# Patient Record
Sex: Female | Born: 1955 | Race: White | Hispanic: No | Smoking: Former smoker
Health system: Southern US, Community
[De-identification: ages and names within clinical notes are randomized; demographics above are authoritative.]

## PROBLEM LIST (undated history)

## (undated) DIAGNOSIS — M1990 Unspecified osteoarthritis, unspecified site: Secondary | ICD-10-CM

## (undated) DIAGNOSIS — M3500 Sicca syndrome, unspecified: Secondary | ICD-10-CM

## (undated) DIAGNOSIS — M069 Rheumatoid arthritis, unspecified: Secondary | ICD-10-CM

## (undated) DIAGNOSIS — I10 Essential (primary) hypertension: Secondary | ICD-10-CM

## (undated) HISTORY — DX: Sjogren syndrome, unspecified: M35.00

## (undated) HISTORY — PX: FOOT SURGERY: SHX648

## (undated) HISTORY — DX: Rheumatoid arthritis, unspecified: M06.9

## (undated) HISTORY — PX: COLONOSCOPY: SHX174

---

## 2015-11-13 HISTORY — PX: COLONOSCOPY: SHX174

## 2017-03-06 ENCOUNTER — Other Ambulatory Visit: Payer: Self-pay | Admitting: Obstetrics and Gynecology

## 2017-03-06 DIAGNOSIS — Z1231 Encounter for screening mammogram for malignant neoplasm of breast: Secondary | ICD-10-CM

## 2017-03-20 ENCOUNTER — Encounter: Payer: Self-pay | Admitting: Radiology

## 2017-03-20 ENCOUNTER — Ambulatory Visit
Admission: RE | Admit: 2017-03-20 | Discharge: 2017-03-20 | Disposition: A | Discharge status: Home / Self Care | Payer: 59 | Source: Ambulatory Visit | Attending: Obstetrics and Gynecology | Admitting: Obstetrics and Gynecology

## 2017-03-20 DIAGNOSIS — Z1231 Encounter for screening mammogram for malignant neoplasm of breast: Secondary | ICD-10-CM | POA: Diagnosis not present

## 2017-03-25 ENCOUNTER — Other Ambulatory Visit: Payer: Self-pay | Admitting: *Deleted

## 2017-03-25 ENCOUNTER — Inpatient Hospital Stay
Admission: RE | Admit: 2017-03-25 | Discharge: 2017-03-25 | Disposition: A | Discharge status: Home / Self Care | Payer: Self-pay | Source: Ambulatory Visit | Attending: *Deleted | Admitting: *Deleted

## 2017-03-25 DIAGNOSIS — Z9289 Personal history of other medical treatment: Secondary | ICD-10-CM

## 2017-11-21 ENCOUNTER — Ambulatory Visit
Admission: EM | Admit: 2017-11-21 | Discharge: 2017-11-21 | Disposition: A | Discharge status: Home / Self Care | Payer: 59 | Attending: Family Medicine | Admitting: Family Medicine

## 2017-11-21 ENCOUNTER — Encounter: Payer: Self-pay | Admitting: *Deleted

## 2017-11-21 ENCOUNTER — Telehealth: Payer: Self-pay

## 2017-11-21 DIAGNOSIS — M722 Plantar fascial fibromatosis: Secondary | ICD-10-CM | POA: Diagnosis not present

## 2017-11-21 DIAGNOSIS — M79672 Pain in left foot: Secondary | ICD-10-CM

## 2017-11-21 HISTORY — DX: Essential (primary) hypertension: I10

## 2017-11-21 MED ORDER — MELOXICAM 15 MG PO TABS: 15.0000 mg | ORAL_TABLET | Freq: Every day | ORAL | 0 refills | Status: DC | PRN

## 2017-11-21 NOTE — ED Triage Notes (Signed)
Left heel and foot pain x1 month. Denies injury.

## 2017-11-21 NOTE — Discharge Instructions (Signed)
Heel cups.  Rest, ice.  Medication as prescribed.  Exercises: 10 times daily (holds are for 3-5 secs).  Take care  Dr. Adriana Simas

## 2017-11-21 NOTE — ED Provider Notes (Signed)
[facility]   CSN: [id number] Arrival date & time: 11/21/17  0910  History   Chief Complaint Chief Complaint  Patient presents with  . Foot Pain   HPI  62 year old female presents with heel pain.  Patient reports a one-month history of left heel pain.  No fall, trauma, injury.  She is on her feet often.  Has to wear steel toed shoes at work and is constantly on concrete.  She feels that she has plantar fasciitis.  She has been trying stretches with use of a roller without resolution.  She states that she has pain with walking.  Pain is currently 5/10 in severity.  Worse with activity/ambulation.  No relieving factors.  No other associated symptoms.  No other complaints at this time.  PMH: Anxiety, unspecified    Depression, unspecified    ADD (attention deficit disorder)    Arthritis    Hypertension    Hypercholesteremia     Surgical Hx: bunionectomy   joint taken out later  COLONOSCOPY 09/21/2016 N/A Procedure: COLONOSCOPY, FLEXIBLE; DIAGNOSTIC; Surgeon: Glynis Smiles, MD; Location: [facility] OR; Service: General Surgery; Laterality: N/A;   OB History    No data available     Home Medications    Prior to Admission medications   Medication Sig Start Date End Date Taking? Authorizing Provider  ALPRAZolam Prudy Feeler) 0.5 MG tablet Take 0.5 mg by mouth at bedtime as needed for anxiety.   Yes [provider]  DULoxetine (CYMBALTA) 60 MG capsule Take 60 mg by mouth daily.   Yes [provider]  lisinopril (PRINIVIL,ZESTRIL) 20 MG tablet Take 20 mg by mouth daily.   Yes [provider]  methylphenidate (CONCERTA) 54 MG PO CR tablet Take 54 mg by mouth every morning.   Yes [provider]  meloxicam (MOBIC) 15 MG tablet Take 1 tablet (15 mg total) by mouth daily as needed for pain. 11/21/17   Tommie Sams, DO   Family History Family History  Problem Relation Age of Onset  . Polymyositis Mother   . CAD Father   . Breast cancer  Neg Hx    Social History Social History   Tobacco Use  . Smoking status: Former Games developer  . Smokeless tobacco: Never Used  Substance Use Topics  . Alcohol use: Yes  . Drug use: No   Allergies   Patient has no known allergies.  Review of Systems Review of Systems  Constitutional: Negative.   Musculoskeletal: Positive for gait problem.       Heel pain.  All other systems reviewed and are negative.  Physical Exam Triage Vital Signs ED Triage Vitals  Enc Vitals Group     BP 11/21/17 0924 (!) 140/99     Pulse Rate 11/21/17 0924 85     Resp 11/21/17 0924 16     Temp 11/21/17 0924 98.5 F (36.9 C)     Temp Source 11/21/17 0924 Oral     SpO2 11/21/17 0924 98 %     Weight 11/21/17 0926 155 lb (70.3 kg)     Height 11/21/17 0926 5\' 3"  (1.6 m)     Head Circumference --      Peak Flow --      Pain Score 11/21/17 0926 5     Pain Loc --      Pain Edu? --      Excl. in GC? --    Updated Vital Signs BP (!) 140/99 (BP Location: Left Arm)   Pulse 85  Temp 98.5 F (36.9 C) (Oral)   Resp 16   Ht 5\' 3"  (1.6 m)   Wt 155 lb (70.3 kg)   SpO2 98%   BMI 27.46 kg/m    Physical Exam  Constitutional: She is oriented to person, place, and time. She appears well-developed. No distress.  Cardiovascular: Normal rate and regular rhythm.  No murmur heard. Pulmonary/Chest: Effort normal and breath sounds normal. She has no wheezes. She has no rales.  Musculoskeletal:  Left foot -patient with tenderness at the heel at the attachment site of the plantar fascia.  Swelling noted of the heel.  Normal range of motion of the ankle.  Ankle nontender.  Neurological: She is alert and oriented to person, place, and time.  Skin: Skin is warm. No rash noted.  Psychiatric: She has a normal mood and affect. Her behavior is normal.  Nursing note and vitals reviewed.  UC Treatments / Results  Labs (all labs ordered are listed, but only abnormal results are displayed) Labs Reviewed - No data to  display  EKG  EKG Interpretation None       Radiology No results found.  Procedures Procedures (including critical care time)  Medications Ordered in UC Medications - No data to display   Initial Impression / Assessment and Plan / UC Course  I have reviewed the triage vital signs and the nursing notes.  Pertinent labs & imaging results that were available during my care of the patient were reviewed by me and considered in my medical decision making (see chart for details).     62 year old female presents with plantar fasciitis.  Treating with rest, exercises, heel cups, meloxicam.  Final Clinical Impressions(s) / UC Diagnoses   Final diagnoses:  Plantar fasciitis of left foot    ED Discharge Orders        Ordered    meloxicam (MOBIC) 15 MG tablet  Daily PRN     11/21/17 0941     Controlled Substance Prescriptions [place] Controlled Substance Registry consulted? Not Applicable   01/19/18, DO 11/21/17 1000

## 2017-11-22 ENCOUNTER — Encounter: Payer: Self-pay | Admitting: Obstetrics and Gynecology

## 2017-11-22 ENCOUNTER — Ambulatory Visit (INDEPENDENT_AMBULATORY_CARE_PROVIDER_SITE_OTHER): Payer: 59 | Admitting: Obstetrics and Gynecology

## 2017-11-22 DIAGNOSIS — Z124 Encounter for screening for malignant neoplasm of cervix: Secondary | ICD-10-CM

## 2017-11-22 DIAGNOSIS — Z01419 Encounter for gynecological examination (general) (routine) without abnormal findings: Secondary | ICD-10-CM

## 2017-11-22 DIAGNOSIS — Z1231 Encounter for screening mammogram for malignant neoplasm of breast: Secondary | ICD-10-CM | POA: Diagnosis not present

## 2017-11-22 DIAGNOSIS — Z1239 Encounter for other screening for malignant neoplasm of breast: Secondary | ICD-10-CM

## 2017-11-22 NOTE — Patient Instructions (Signed)
Preventive Care 40-64 Years, Female Preventive care refers to lifestyle choices and visits with your health care provider that can promote health and wellness. What does preventive care include?  A yearly physical exam. This is also called an annual well check.  Dental exams once or twice a year.  Routine eye exams. Ask your health care provider how often you should have your eyes checked.  Personal lifestyle choices, including: ? Daily care of your teeth and gums. ? Regular physical activity. ? Eating a healthy diet. ? Avoiding tobacco and drug use. ? Limiting alcohol use. ? Practicing safe sex. ? Taking low-dose aspirin daily starting at age 71. ? Taking vitamin and mineral supplements as recommended by your health care provider. What happens during an annual well check? The services and screenings done by your health care provider during your annual well check will depend on your age, overall health, lifestyle risk factors, and family history of disease. Counseling Your health care provider may ask you questions about your:  Alcohol use.  Tobacco use.  Drug use.  Emotional well-being.  Home and relationship well-being.  Sexual activity.  Eating habits.  Work and work Statistician.  Method of birth control.  Menstrual cycle.  Pregnancy history.  Screening You may have the following tests or measurements:  Height, weight, and BMI.  Blood pressure.  Lipid and cholesterol levels. These may be checked every 5 years, or more frequently if you are over 8 years old.  Skin check.  Lung cancer screening. You may have this screening every year starting at age 72 if you have a 30-pack-year history of smoking and currently smoke or have quit within the past 15 years.  Fecal occult blood test (FOBT) of the stool. You may have this test every year starting at age 42.  Flexible sigmoidoscopy or colonoscopy. You may have a sigmoidoscopy every 5 years or a colonoscopy  every 10 years starting at age 41.  Hepatitis C blood test.  Hepatitis B blood test.  Sexually transmitted disease (STD) testing.  Diabetes screening. This is done by checking your blood sugar (glucose) after you have not eaten for a while (fasting). You may have this done every 1-3 years.  Mammogram. This may be done every 1-2 years. Talk to your health care provider about when you should start having regular mammograms. This may depend on whether you have a family history of breast cancer.  BRCA-related cancer screening. This may be done if you have a family history of breast, ovarian, tubal, or peritoneal cancers.  Pelvic exam and Pap test. This may be done every 3 years starting at age 40. Starting at age 45, this may be done every 5 years if you have a Pap test in combination with an HPV test.  Bone density scan. This is done to screen for osteoporosis. You may have this scan if you are at high risk for osteoporosis.  Discuss your test results, treatment options, and if necessary, the need for more tests with your health care provider. Vaccines Your health care provider may recommend certain vaccines, such as:  Influenza vaccine. This is recommended every year.  Tetanus, diphtheria, and acellular pertussis (Tdap, Td) vaccine. You may need a Td booster every 10 years.  Varicella vaccine. You may need this if you have not been vaccinated.  Zoster vaccine. You may need this after age 91.  Measles, mumps, and rubella (MMR) vaccine. You may need at least one dose of MMR if you were born in  1957 or later. You may also need a second dose.  Pneumococcal 13-valent conjugate (PCV13) vaccine. You may need this if you have certain conditions and were not previously vaccinated.  Pneumococcal polysaccharide (PPSV23) vaccine. You may need one or two doses if you smoke cigarettes or if you have certain conditions.  Meningococcal vaccine. You may need this if you have certain  conditions.  Hepatitis A vaccine. You may need this if you have certain conditions or if you travel or work in places where you may be exposed to hepatitis A.  Hepatitis B vaccine. You may need this if you have certain conditions or if you travel or work in places where you may be exposed to hepatitis B.  Haemophilus influenzae type b (Hib) vaccine. You may need this if you have certain conditions.  Talk to your health care provider about which screenings and vaccines you need and how often you need them. This information is not intended to replace advice given to you by your health care provider. Make sure you discuss any questions you have with your health care provider. Document Released: 11/25/2015 Document Revised: 07/18/2016 Document Reviewed: 08/30/2015 Elsevier Interactive Patient Education  2018 Elsevier Inc.  

## 2017-11-22 NOTE — Progress Notes (Signed)
Patient ID: Lauren Bush, female   DOB: Nov 29, 1955, 62 y.o.   MRN: [id number]     Gynecology Annual Exam  PCP: Garry Heater, MD  Chief Complaint:  Chief Complaint  Patient presents with  . Gynecologic Exam    History of Present Illness:Patient is a 62 y.o. [id number] presents for annual exam. The patient has no complaints today.   LMP: No LMP recorded. Patient is postmenopausal.  The patient is sexually active. She denies dyspareunia.  The patient does perform self breast exams.  There is no notable family history of breast or ovarian cancer in her family.  The patient wears seatbelts: yes.   The patient has regular exercise: not asked.    The patient denies current symptoms of depression.     Review of Systems: Review of Systems  Constitutional: Negative for chills and fever.  HENT: Negative for congestion.   Respiratory: Negative for cough and shortness of breath.   Cardiovascular: Negative for chest pain and palpitations.  Gastrointestinal: Negative for abdominal pain, constipation, diarrhea, heartburn, nausea and vomiting.  Genitourinary: Negative for dysuria, frequency and urgency.  Skin: Negative for itching and rash.  Neurological: Negative for dizziness and headaches.  Endo/Heme/Allergies: Negative for polydipsia.  Psychiatric/Behavioral: Negative for depression.    Past Medical History:  Past Medical History:  Diagnosis Date  . Hypertension     Past Surgical History:  History reviewed. No pertinent surgical history.  Gynecologic History:  No LMP recorded. Patient is postmenopausal. Last Pap: Results were: 10/17/2016 NIL and HR HPV negative  Last mammogram: 03/20/17 Results were: Elby Showers I Obstetric History: [id number]  Family History:  Family History  Problem Relation Age of Onset  . Polymyositis Mother   . CAD Father   . Breast cancer Neg Hx     Social History:  Social History   Socioeconomic History  . Marital status: Divorced    Spouse  name: Not on file  . Number of children: Not on file  . Years of education: Not on file  . Highest education level: Not on file  Social Needs  . Financial resource strain: Not on file  . Food insecurity - worry: Not on file  . Food insecurity - inability: Not on file  . Transportation needs - medical: Not on file  . Transportation needs - non-medical: Not on file  Occupational History  . Not on file  Tobacco Use  . Smoking status: Former Games developer  . Smokeless tobacco: Never Used  Substance and Sexual Activity  . Alcohol use: Yes  . Drug use: No  . Sexual activity: Yes    Birth control/protection: None  Other Topics Concern  . Not on file  Social History Narrative  . Not on file    Allergies:  No Known Allergies  Medications: Prior to Admission medications   Medication Sig Start Date End Date Taking? Authorizing Provider  ALPRAZolam Prudy Feeler) 0.5 MG tablet Take 0.5 mg by mouth at bedtime as needed for anxiety.   Yes [provider]  DULoxetine (CYMBALTA) 60 MG capsule Take 60 mg by mouth daily.   Yes [provider]  lisinopril (PRINIVIL,ZESTRIL) 20 MG tablet Take 20 mg by mouth daily.   Yes [provider]  meloxicam (MOBIC) 15 MG tablet Take 1 tablet (15 mg total) by mouth daily as needed for pain. 11/21/17  Yes Cook, Jayce G, DO  methylphenidate (CONCERTA) 54 MG PO CR tablet Take 54 mg by mouth every morning.   Yes [provider]    Physical Exam Vitals: Blood pressure (!) 142/98, pulse 93, height 5\' 3"  (1.6 m), weight 163 lb (73.9 kg).  General: NAD HEENT: normocephalic, anicteric Thyroid: no enlargement, no palpable nodules Pulmonary: No increased work of breathing, CTAB Cardiovascular: RRR, distal pulses 2+ Breast: Breast symmetrical, no tenderness, no palpable nodules or masses, no skin or nipple retraction present, no nipple discharge.  No axillary or supraclavicular lymphadenopathy. Abdomen: NABS, soft, non-tender, non-distended.   Umbilicus without lesions.  No hepatomegaly, splenomegaly or masses palpable. No evidence of hernia  Genitourinary:  External: Normal external female genitalia.  Normal urethral meatus, normal  Bartholin's and Skene's glands.    Vagina: Normal vaginal mucosa, no evidence of prolapse.    Cervix: Grossly normal in appearance, no bleeding  Uterus: Non-enlarged, mobile, normal contour.  No CMT  Adnexa: ovaries non-enlarged, no adnexal masses  Rectal: deferred  Lymphatic: no evidence of inguinal lymphadenopathy Extremities: no edema, erythema, or tenderness Neurologic: Grossly intact Psychiatric: mood appropriate, affect full  Female chaperone present for pelvic and breast  portions of the physical exam     Assessment: 62 y.o. 77 routine annual exam  Plan: Problem List Items Addressed This Visit    None    Visit Diagnoses    Screening for malignant neoplasm of cervix       Relevant Orders   PapIG, HPV, rfx 16/18   Breast screening       Encounter for gynecological examination without abnormal finding          1) Mammogram - recommend yearly screening mammogram.  Mammogram Is up to date - order placed for next mammogram in May  2) STI screening was not offered - monogamous 3 year relationship  3) ASCCP guidelines and rational discussed.  Patient opts for yearly screening interval  4) Osteoporosis  - per USPTF routine screening DEXA at age 50  5) Routine healthcare maintenance including cholesterol, diabetes screening discussed managed by PCP  6) Colonoscopy UTD 09/21/2016  7) Increased under arm odor - she denies hyperhydrosis just increased odor.  She read some data on botox injection.  I told her that a dermatologist that also does aesthetics is probably the most likely to be able to aid her.  Follow up 1 year for routine annual

## 2017-11-26 LAB — PAPIG, HPV, RFX 16/18
HPV, high-risk: NEGATIVE
PAP Smear Comment: 0

## 2017-11-29 ENCOUNTER — Encounter: Payer: Self-pay | Admitting: Obstetrics and Gynecology

## 2017-12-16 ENCOUNTER — Other Ambulatory Visit: Payer: Self-pay | Admitting: Family Medicine

## 2018-03-27 ENCOUNTER — Encounter (INDEPENDENT_AMBULATORY_CARE_PROVIDER_SITE_OTHER): Payer: Self-pay

## 2018-03-27 ENCOUNTER — Ambulatory Visit
Admission: RE | Admit: 2018-03-27 | Discharge: 2018-03-27 | Disposition: A | Discharge status: Home / Self Care | Payer: 59 | Source: Ambulatory Visit | Attending: Obstetrics and Gynecology | Admitting: Obstetrics and Gynecology

## 2018-03-27 DIAGNOSIS — Z1239 Encounter for other screening for malignant neoplasm of breast: Secondary | ICD-10-CM

## 2018-03-27 DIAGNOSIS — Z1231 Encounter for screening mammogram for malignant neoplasm of breast: Secondary | ICD-10-CM | POA: Diagnosis not present

## 2018-03-27 IMAGING — MG MM DIGITAL SCREENING BILAT W/ CAD
4 series · 4 of 4 positions shown · non-contrast
Comparison: Previous exam(s).

CLINICAL DATA: Screening.

EXAM:
DIGITAL SCREENING BILATERAL MAMMOGRAM WITH CAD

[L CC]
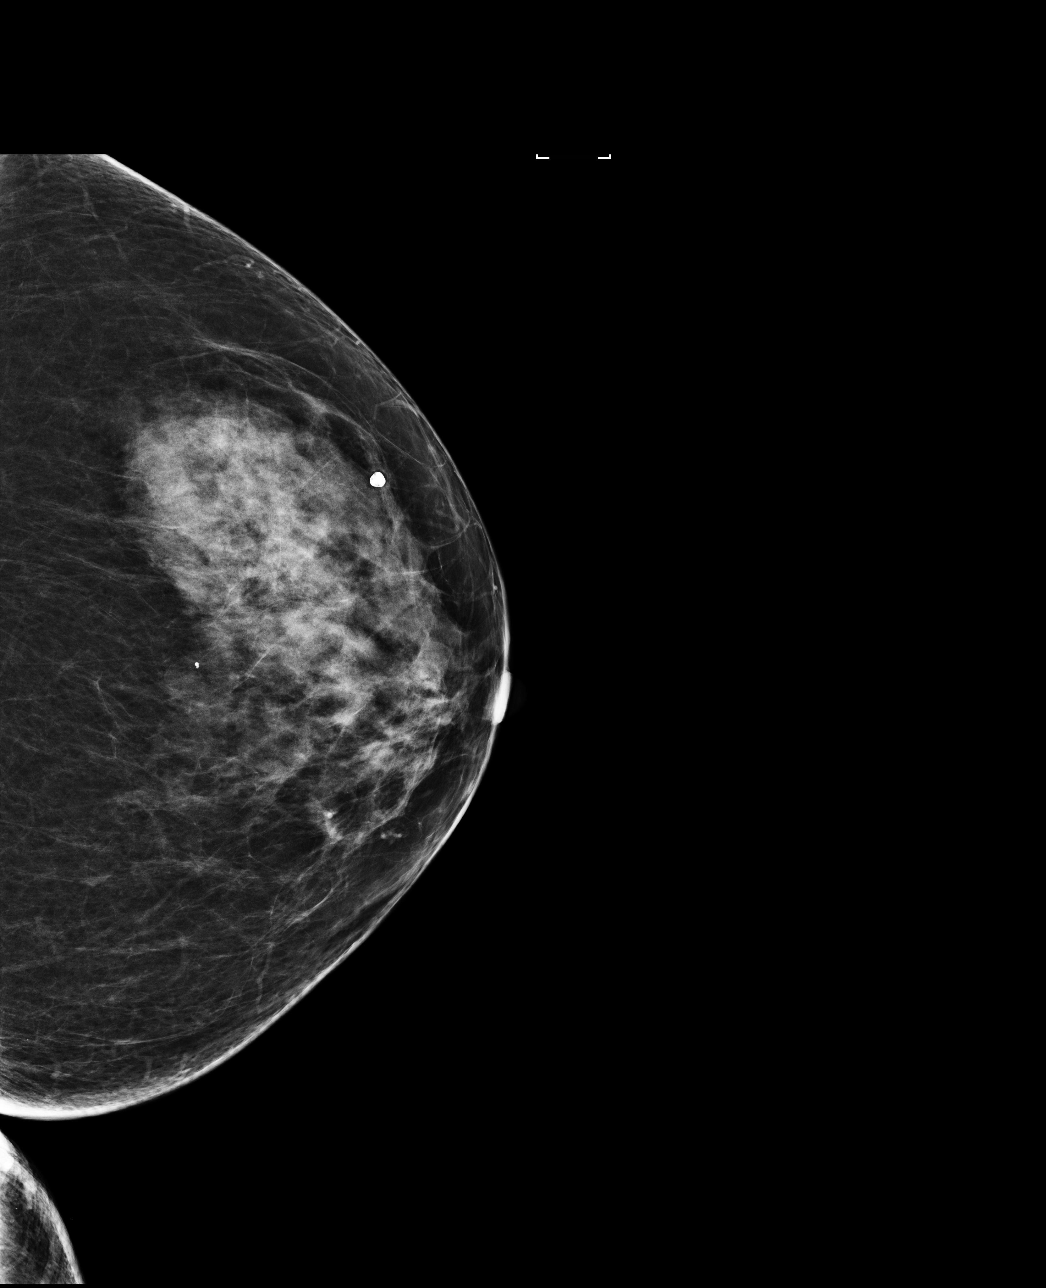

[L MLO]
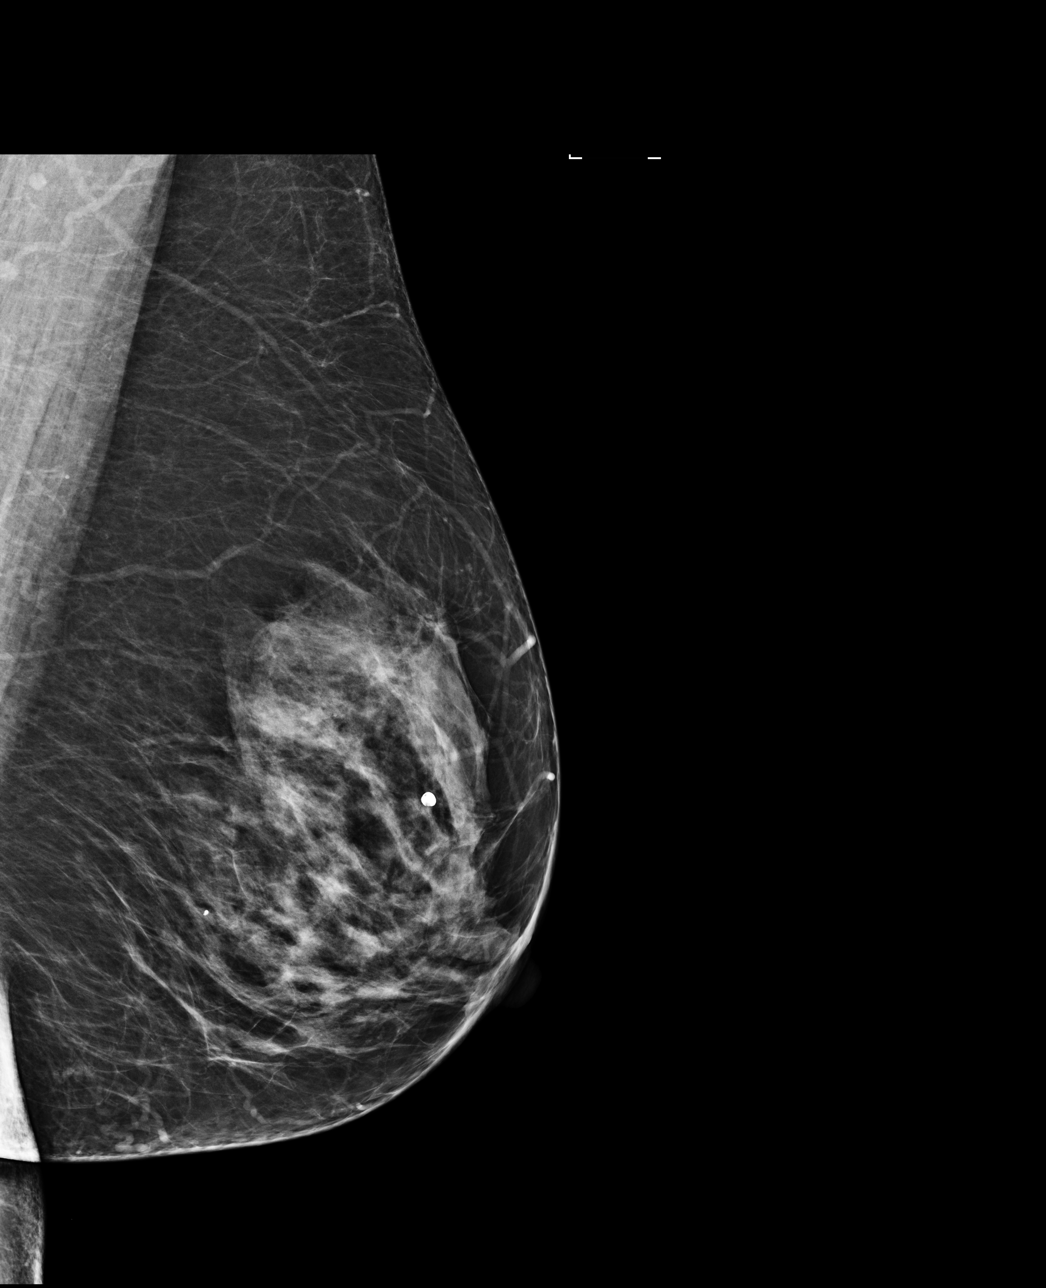

[R CC]
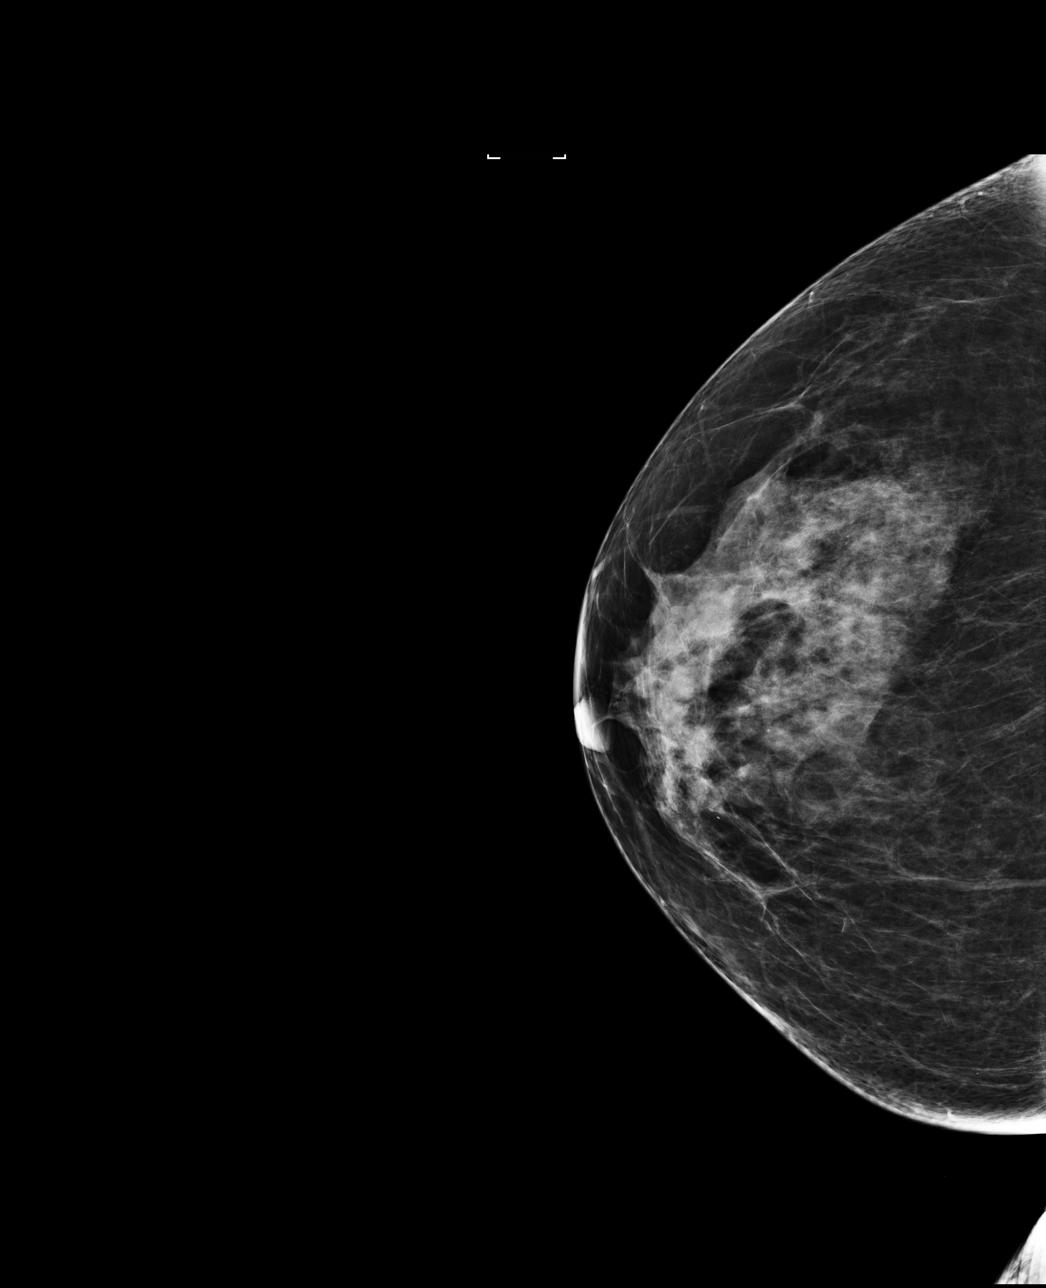

[R MLO]
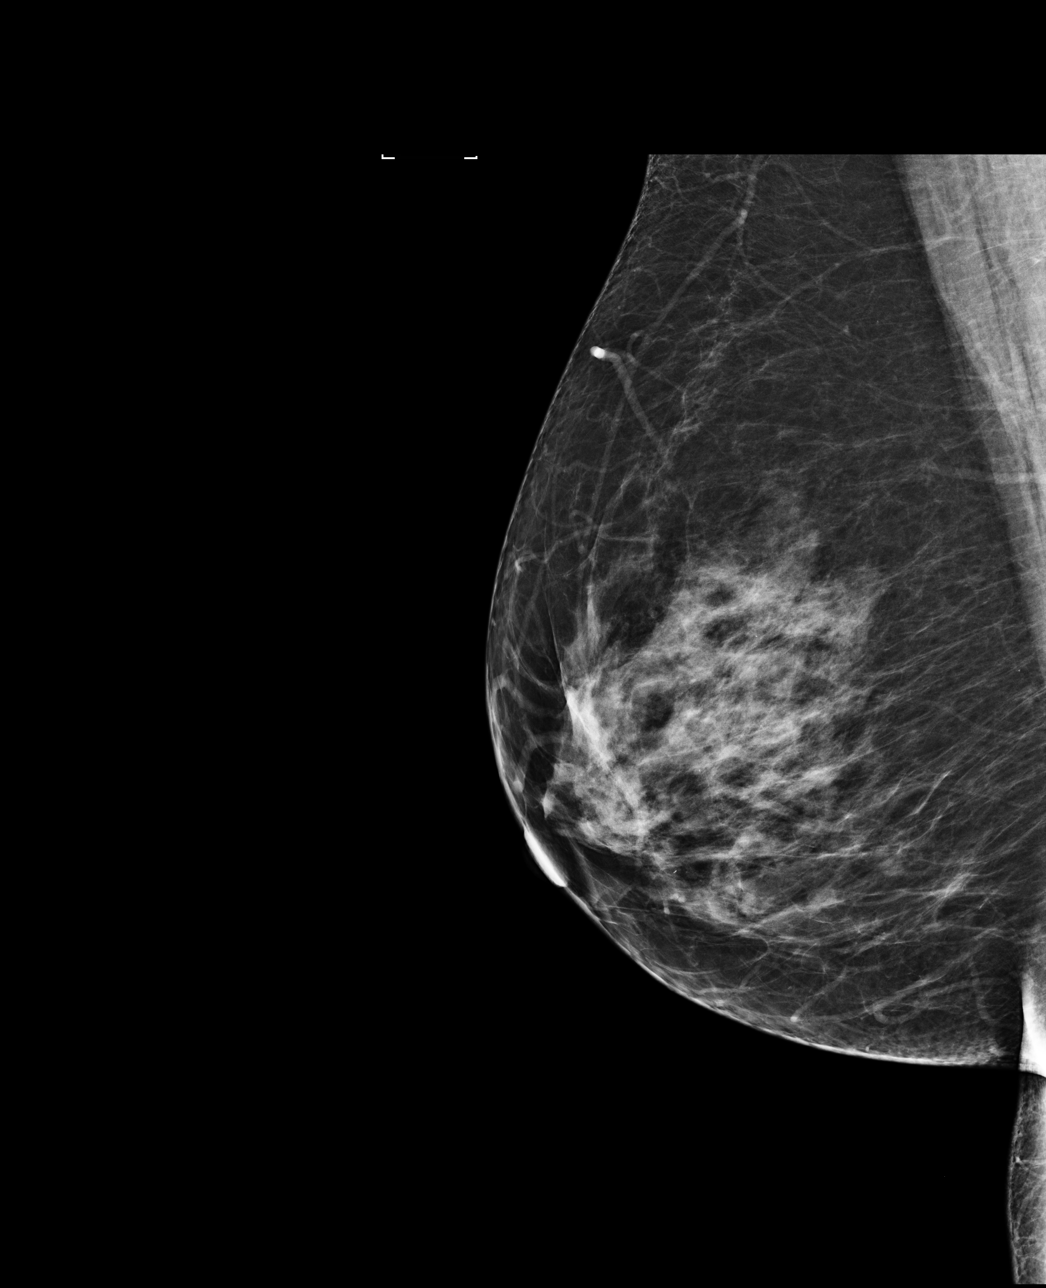

[4 of 4 positions shown; findings below may reference images not displayed]

ACR Breast Density Category c: The breast tissue is heterogeneously
dense, which may obscure small masses.
FINDINGS: There are no findings suspicious for malignancy. Images were
processed with CAD.
IMPRESSION: No mammographic evidence of malignancy. A result letter of this
screening mammogram will be mailed directly to the patient.

RECOMMENDATION:
Screening mammogram in one year. (Code:[0J])

BI-RADS CATEGORY  1: Negative.

## 2018-06-17 ENCOUNTER — Other Ambulatory Visit: Payer: Self-pay | Admitting: Orthopedic Surgery

## 2018-06-17 DIAGNOSIS — M2351 Chronic instability of knee, right knee: Secondary | ICD-10-CM

## 2018-06-17 DIAGNOSIS — M25361 Other instability, right knee: Secondary | ICD-10-CM

## 2018-06-17 DIAGNOSIS — M2391 Unspecified internal derangement of right knee: Secondary | ICD-10-CM

## 2018-06-17 DIAGNOSIS — G8929 Other chronic pain: Secondary | ICD-10-CM

## 2018-06-17 DIAGNOSIS — M25561 Pain in right knee: Secondary | ICD-10-CM

## 2018-06-25 ENCOUNTER — Other Ambulatory Visit: Payer: 59

## 2018-08-27 ENCOUNTER — Other Ambulatory Visit: Payer: Self-pay

## 2018-08-27 ENCOUNTER — Encounter: Payer: Self-pay | Admitting: *Deleted

## 2018-08-27 NOTE — Anesthesia Preprocedure Evaluation (Addendum)
Anesthesia Evaluation  Patient identified by MRN, date of birth, ID band Patient awake    Reviewed: Allergy & Precautions, H&P , NPO status , Patient's Chart, lab work & pertinent test results  Airway Mallampati: II  TM Distance: >3 FB Neck ROM: full    Dental no notable dental hx.    Pulmonary former smoker,    Pulmonary exam normal breath sounds clear to auscultation       Cardiovascular hypertension, On Medications Normal cardiovascular exam Rhythm:regular Rate:Normal     Neuro/Psych    GI/Hepatic negative GI ROS, Neg liver ROS,   Endo/Other  negative endocrine ROS  Renal/GU negative Renal ROS     Musculoskeletal   Abdominal   Peds  Hematology negative hematology ROS (+)   Anesthesia Other Findings   Reproductive/Obstetrics                            Anesthesia Physical Anesthesia Plan  ASA: II  Anesthesia Plan: MAC   Post-op Pain Management:    Induction:   PONV Risk Score and Plan: 2 and Midazolam and Treatment may vary due to age or medical condition  Airway Management Planned:   Additional Equipment:   Intra-op Plan:   Post-operative Plan:   Informed Consent: I have reviewed the patients History and Physical, chart, labs and discussed the procedure including the risks, benefits and alternatives for the proposed anesthesia with the patient or authorized representative who has indicated his/her understanding and acceptance.     Plan Discussed with: CRNA  Anesthesia Plan Comments:         Anesthesia Quick Evaluation

## 2018-08-29 NOTE — Discharge Instructions (Signed)

## 2018-09-02 ENCOUNTER — Ambulatory Visit: Payer: 59 | Admitting: Anesthesiology

## 2018-09-02 ENCOUNTER — Ambulatory Visit
Admission: RE | Admit: 2018-09-02 | Discharge: 2018-09-02 | Disposition: A | Discharge status: Home / Self Care | Payer: 59 | Source: Ambulatory Visit | Attending: Ophthalmology | Admitting: Ophthalmology

## 2018-09-02 ENCOUNTER — Encounter
Admission: RE | Disposition: A | Discharge status: Home / Self Care | Payer: Self-pay | Source: Ambulatory Visit | Attending: Ophthalmology

## 2018-09-02 DIAGNOSIS — F329 Major depressive disorder, single episode, unspecified: Secondary | ICD-10-CM | POA: Insufficient documentation

## 2018-09-02 DIAGNOSIS — I10 Essential (primary) hypertension: Secondary | ICD-10-CM | POA: Diagnosis not present

## 2018-09-02 DIAGNOSIS — F419 Anxiety disorder, unspecified: Secondary | ICD-10-CM | POA: Insufficient documentation

## 2018-09-02 DIAGNOSIS — F988 Other specified behavioral and emotional disorders with onset usually occurring in childhood and adolescence: Secondary | ICD-10-CM | POA: Insufficient documentation

## 2018-09-02 DIAGNOSIS — M1990 Unspecified osteoarthritis, unspecified site: Secondary | ICD-10-CM | POA: Diagnosis not present

## 2018-09-02 DIAGNOSIS — H2511 Age-related nuclear cataract, right eye: Secondary | ICD-10-CM | POA: Insufficient documentation

## 2018-09-02 DIAGNOSIS — J40 Bronchitis, not specified as acute or chronic: Secondary | ICD-10-CM | POA: Diagnosis not present

## 2018-09-02 DIAGNOSIS — Z87891 Personal history of nicotine dependence: Secondary | ICD-10-CM | POA: Insufficient documentation

## 2018-09-02 HISTORY — PX: CATARACT EXTRACTION W/PHACO: SHX586

## 2018-09-02 HISTORY — DX: Unspecified osteoarthritis, unspecified site: M19.90

## 2018-09-02 SURGERY — PHACOEMULSIFICATION, CATARACT, WITH IOL INSERTION
Anesthesia: Monitor Anesthesia Care | Site: Eye | Laterality: Right

## 2018-09-02 MED ORDER — LIDOCAINE HCL (PF) 2 % IJ SOLN
INTRAOCULAR | Status: DC | PRN
  Administered 2018-09-02: 11:00:00 via INTRAOCULAR

## 2018-09-02 MED ORDER — MOXIFLOXACIN HCL 0.5 % OP SOLN
OPHTHALMIC | Status: DC | PRN
  Administered 2018-09-02: 0.2 mL via OPHTHALMIC

## 2018-09-02 MED ORDER — SODIUM HYALURONATE 10 MG/ML IO SOLN
INTRAOCULAR | Status: DC | PRN
  Administered 2018-09-02: 0.55 mL via INTRAOCULAR

## 2018-09-02 MED ORDER — ARMC OPHTHALMIC DILATING DROPS
1.0000 "application " | OPHTHALMIC | Status: DC | PRN
  Administered 2018-09-02 (×3): 1 via OPHTHALMIC

## 2018-09-02 MED ORDER — EPINEPHRINE PF 1 MG/ML IJ SOLN
INTRAOCULAR | Status: DC | PRN
  Administered 2018-09-02: 65 mL via OPHTHALMIC

## 2018-09-02 MED ORDER — TETRACAINE HCL 0.5 % OP SOLN
1.0000 [drp] | OPHTHALMIC | Status: DC | PRN
  Administered 2018-09-02 (×2): 1 [drp] via OPHTHALMIC

## 2018-09-02 MED ORDER — LACTATED RINGERS IV SOLN: INTRAVENOUS | Status: DC

## 2018-09-02 MED ORDER — MIDAZOLAM HCL 2 MG/2ML IJ SOLN
INTRAMUSCULAR | Status: DC | PRN
  Administered 2018-09-02 (×2): 1 mg via INTRAVENOUS

## 2018-09-02 MED ORDER — SODIUM HYALURONATE 23 MG/ML IO SOLN
INTRAOCULAR | Status: DC | PRN
  Administered 2018-09-02: 0.6 mL via INTRAOCULAR

## 2018-09-02 MED ORDER — FENTANYL CITRATE (PF) 100 MCG/2ML IJ SOLN
INTRAMUSCULAR | Status: DC | PRN
  Administered 2018-09-02: 50 ug via INTRAVENOUS

## 2018-09-02 SURGICAL SUPPLY — 17 items
CANNULA ANT/CHMB 27G (MISCELLANEOUS) ×1 IMPLANT
CANNULA ANT/CHMB 27GA (MISCELLANEOUS) ×3 IMPLANT
DISSECTOR HYDRO NUCLEUS 50X22 (MISCELLANEOUS) ×3 IMPLANT
GLOVE BIO SURGEON STRL SZ8 (GLOVE) ×3 IMPLANT
GLOVE SURG LX 7.5 STRW (GLOVE) ×2
GLOVE SURG LX STRL 7.5 STRW (GLOVE) ×1 IMPLANT
GOWN STRL REUS W/ TWL LRG LVL3 (GOWN DISPOSABLE) ×2 IMPLANT
GOWN STRL REUS W/TWL LRG LVL3 (GOWN DISPOSABLE) ×4
LENS IOL TECNIS ITEC 16.5 (Intraocular Lens) ×2 IMPLANT
MARKER SKIN DUAL TIP RULER LAB (MISCELLANEOUS) ×3 IMPLANT
PACK DR. KING ARMS (PACKS) ×3 IMPLANT
PACK EYE AFTER SURG (MISCELLANEOUS) ×3 IMPLANT
PACK OPTHALMIC (MISCELLANEOUS) ×3 IMPLANT
SYR 3ML LL SCALE MARK (SYRINGE) ×3 IMPLANT
SYR TB 1ML LUER SLIP (SYRINGE) ×3 IMPLANT
WATER STERILE IRR 500ML POUR (IV SOLUTION) ×3 IMPLANT
WIPE NON LINTING 3.25X3.25 (MISCELLANEOUS) ×3 IMPLANT

## 2018-09-02 NOTE — Anesthesia Procedure Notes (Signed)
Procedure Name: MAC Performed by: Osby Sweetin, CRNA Pre-anesthesia Checklist: Patient identified, Emergency Drugs available, Suction available, Timeout performed and Patient being monitored Patient Re-evaluated:Patient Re-evaluated prior to induction Oxygen Delivery Method: Nasal cannula Placement Confirmation: positive ETCO2       

## 2018-09-02 NOTE — Transfer of Care (Signed)
Immediate Anesthesia Transfer of Care Note  Patient: Lauren Bush  Procedure(s) Performed: CATARACT EXTRACTION PHACO AND INTRAOCULAR LENS PLACEMENT ([facility])  RIGHT (Right Eye)  Patient Location: PACU  Anesthesia Type: MAC  Level of Consciousness: awake, alert  and patient cooperative  Airway and Oxygen Therapy: Patient Spontanous Breathing and Patient connected to supplemental oxygen  Post-op Assessment: Post-op Vital signs reviewed, Patient's Cardiovascular Status Stable, Respiratory Function Stable, Patent Airway and No signs of Nausea or vomiting  Post-op Vital Signs: Reviewed and stable  Complications: No apparent anesthesia complications

## 2018-09-02 NOTE — H&P (Signed)
The History and Physical notes are on paper, have been signed, and are to be scanned.   I have examined the patient and there are no changes to the H&P.   Willey Blade 09/02/2018 10:24 AM

## 2018-09-02 NOTE — Op Note (Signed)
OPERATIVE NOTE  Lauren Bush [id number] 09/02/2018   PREOPERATIVE DIAGNOSIS:  Nuclear sclerotic cataract right eye.  H25.11   POSTOPERATIVE DIAGNOSIS:    Nuclear sclerotic cataract right eye.     PROCEDURE:  Phacoemusification with posterior chamber intraocular lens placement of the right eye   LENS:   Implant Name Type Inv. Item Serial No. Manufacturer Lot No. LRB No. Used  LENS IOL DIOP 16.5 - [id number] Intraocular Lens LENS IOL DIOP 16.5 [id number]  Right 1       PCB00 +16.5   ULTRASOUND TIME: 0 minutes 29 seconds.  CDE 3.66   SURGEON:  Willey Blade, MD, MPH  ANESTHESIOLOGIST: Anesthesiologist: Ranee Gosselin, MD CRNA: Maree Krabbe, CRNA   ANESTHESIA:  Topical with tetracaine drops augmented with 1% preservative-free intracameral lidocaine.  ESTIMATED BLOOD LOSS: less than 1 mL.   COMPLICATIONS:  None.   DESCRIPTION OF PROCEDURE:  The patient was identified in the holding room and transported to the operating room and placed in the supine position under the operating microscope.  The right eye was identified as the operative eye and it was prepped and draped in the usual sterile ophthalmic fashion.   A 1.0 millimeter clear-corneal paracentesis was made at the 10:30 position. 0.5 ml of preservative-free 1% lidocaine with epinephrine was injected into the anterior chamber.  The anterior chamber was filled with Healon 5 viscoelastic.  A 2.4 millimeter keratome was used to make a near-clear corneal incision at the 8:00 position.  A curvilinear capsulorrhexis was made with a cystotome and capsulorrhexis forceps.  Balanced salt solution was used to hydrodissect and hydrodelineate the nucleus.   Phacoemulsification was then used in stop and chop fashion to remove the lens nucleus and epinucleus.  The remaining cortex was then removed using the irrigation and aspiration handpiece. Healon was then placed into the capsular bag to distend it for lens placement.  A lens was  then injected into the capsular bag.  The remaining viscoelastic was aspirated.   Wounds were hydrated with balanced salt solution.  The anterior chamber was inflated to a physiologic pressure with balanced salt solution.   Intracameral vigamox 0.1 mL undiluted was injected into the eye and a drop placed onto the ocular surface.  No wound leaks were noted.  The patient was taken to the recovery room in stable condition without complications of anesthesia or surgery  Willey Blade 09/02/2018, 10:54 AM

## 2018-09-02 NOTE — Anesthesia Postprocedure Evaluation (Signed)
Anesthesia Post Note  Patient: Tannah Dreyfuss Swire  Procedure(s) Performed: CATARACT EXTRACTION PHACO AND INTRAOCULAR LENS PLACEMENT ([place])  RIGHT (Right Eye)  Patient location during evaluation: PACU Anesthesia Type: MAC Level of consciousness: awake and alert and oriented Pain management: satisfactory to patient Vital Signs Assessment: post-procedure vital signs reviewed and stable Respiratory status: spontaneous breathing, nonlabored ventilation and respiratory function stable Cardiovascular status: blood pressure returned to baseline and stable Postop Assessment: Adequate PO intake and No signs of nausea or vomiting Anesthetic complications: no    Raliegh Ip

## 2018-09-03 ENCOUNTER — Encounter: Payer: Self-pay | Admitting: Ophthalmology

## 2018-10-29 ENCOUNTER — Other Ambulatory Visit: Payer: Self-pay

## 2018-10-29 ENCOUNTER — Encounter: Payer: Self-pay | Admitting: *Deleted

## 2018-11-07 ENCOUNTER — Ambulatory Visit
Admission: RE | Admit: 2018-11-07 | Discharge: 2018-11-07 | Disposition: A | Discharge status: Home / Self Care | Payer: 59 | Attending: Unknown Physician Specialty | Admitting: Unknown Physician Specialty

## 2018-11-07 ENCOUNTER — Encounter
Admission: RE | Disposition: A | Discharge status: Home / Self Care | Payer: Self-pay | Source: Home / Self Care | Attending: Unknown Physician Specialty

## 2018-11-07 ENCOUNTER — Ambulatory Visit: Payer: 59 | Admitting: Anesthesiology

## 2018-11-07 DIAGNOSIS — E7800 Pure hypercholesterolemia, unspecified: Secondary | ICD-10-CM | POA: Diagnosis not present

## 2018-11-07 DIAGNOSIS — F329 Major depressive disorder, single episode, unspecified: Secondary | ICD-10-CM | POA: Diagnosis not present

## 2018-11-07 DIAGNOSIS — G5601 Carpal tunnel syndrome, right upper limb: Secondary | ICD-10-CM | POA: Diagnosis present

## 2018-11-07 DIAGNOSIS — F909 Attention-deficit hyperactivity disorder, unspecified type: Secondary | ICD-10-CM | POA: Diagnosis not present

## 2018-11-07 DIAGNOSIS — F419 Anxiety disorder, unspecified: Secondary | ICD-10-CM | POA: Diagnosis not present

## 2018-11-07 DIAGNOSIS — Z79899 Other long term (current) drug therapy: Secondary | ICD-10-CM | POA: Insufficient documentation

## 2018-11-07 DIAGNOSIS — Z87891 Personal history of nicotine dependence: Secondary | ICD-10-CM | POA: Diagnosis not present

## 2018-11-07 DIAGNOSIS — I10 Essential (primary) hypertension: Secondary | ICD-10-CM | POA: Diagnosis not present

## 2018-11-07 HISTORY — PX: CARPAL TUNNEL RELEASE: SHX101

## 2018-11-07 SURGERY — CARPAL TUNNEL RELEASE
Anesthesia: General | Site: Hand | Laterality: Right

## 2018-11-07 MED ORDER — ACETAMINOPHEN 325 MG PO TABS: 325.0000 mg | ORAL_TABLET | Freq: Once | ORAL | Status: DC

## 2018-11-07 MED ORDER — OXYCODONE HCL 5 MG PO TABS: 5.0000 mg | ORAL_TABLET | Freq: Once | ORAL | Status: DC | PRN

## 2018-11-07 MED ORDER — ONDANSETRON HCL 4 MG/2ML IJ SOLN
INTRAMUSCULAR | Status: DC | PRN
  Administered 2018-11-07: 4 mg via INTRAVENOUS

## 2018-11-07 MED ORDER — PROPOFOL 10 MG/ML IV BOLUS
INTRAVENOUS | Status: DC | PRN
  Administered 2018-11-07: 180 mg via INTRAVENOUS

## 2018-11-07 MED ORDER — DEXAMETHASONE SODIUM PHOSPHATE 4 MG/ML IJ SOLN
INTRAMUSCULAR | Status: DC | PRN
  Administered 2018-11-07: 4 mg via INTRAVENOUS

## 2018-11-07 MED ORDER — OXYCODONE HCL 5 MG/5ML PO SOLN: 5.0000 mg | Freq: Once | ORAL | Status: DC | PRN

## 2018-11-07 MED ORDER — ONDANSETRON HCL 4 MG/2ML IJ SOLN: 4.0000 mg | Freq: Once | INTRAMUSCULAR | Status: DC | PRN

## 2018-11-07 MED ORDER — BUPIVACAINE HCL (PF) 0.5 % IJ SOLN
INTRAMUSCULAR | Status: DC | PRN
  Administered 2018-11-07: 9 mL

## 2018-11-07 MED ORDER — GLYCOPYRROLATE 0.2 MG/ML IJ SOLN: INTRAMUSCULAR | Status: DC | PRN

## 2018-11-07 MED ORDER — MIDAZOLAM HCL 5 MG/5ML IJ SOLN
INTRAMUSCULAR | Status: DC | PRN
  Administered 2018-11-07: 2 mg via INTRAVENOUS

## 2018-11-07 MED ORDER — ACETAMINOPHEN 160 MG/5ML PO SOLN: 325.0000 mg | ORAL | Status: DC | PRN

## 2018-11-07 MED ORDER — FENTANYL CITRATE (PF) 100 MCG/2ML IJ SOLN
INTRAMUSCULAR | Status: DC | PRN
  Administered 2018-11-07 (×4): 25 ug via INTRAVENOUS

## 2018-11-07 MED ORDER — ACETAMINOPHEN 160 MG/5ML PO SOLN: 325.0000 mg | Freq: Once | ORAL | Status: DC

## 2018-11-07 MED ORDER — LACTATED RINGERS IV SOLN
INTRAVENOUS | Status: DC
  Administered 2018-11-07: 08:00:00 via INTRAVENOUS

## 2018-11-07 MED ORDER — ACETAMINOPHEN 325 MG PO TABS: 325.0000 mg | ORAL_TABLET | ORAL | Status: DC | PRN

## 2018-11-07 MED ORDER — NORCO 5-325 MG PO TABS: 1.0000 | ORAL_TABLET | Freq: Four times a day (QID) | ORAL | 0 refills | Status: AC | PRN

## 2018-11-07 MED ORDER — FENTANYL CITRATE (PF) 100 MCG/2ML IJ SOLN: 25.0000 ug | INTRAMUSCULAR | Status: DC | PRN

## 2018-11-07 MED ORDER — LIDOCAINE HCL (CARDIAC) PF 100 MG/5ML IV SOSY
PREFILLED_SYRINGE | INTRAVENOUS | Status: DC | PRN
  Administered 2018-11-07: 30 mg via INTRATRACHEAL

## 2018-11-07 SURGICAL SUPPLY — 29 items
BANDAGE ELASTIC 2 LF NS (GAUZE/BANDAGES/DRESSINGS) ×3 IMPLANT
BNDG ESMARK 4X12 TAN STRL LF (GAUZE/BANDAGES/DRESSINGS) ×3 IMPLANT
BNDG STRETCH 4X75 STRL LF (GAUZE/BANDAGES/DRESSINGS) ×3 IMPLANT
COVER LIGHT HANDLE FLEXIBLE (MISCELLANEOUS) ×3 IMPLANT
CUFF TOURN SGL QUICK 18 (TOURNIQUET CUFF) ×3 IMPLANT
DURAPREP 26ML APPLICATOR (WOUND CARE) ×3 IMPLANT
ELECT REM PT RETURN 9FT ADLT (ELECTROSURGICAL) ×3
ELECTRODE REM PT RTRN 9FT ADLT (ELECTROSURGICAL) ×1 IMPLANT
GAUZE SPONGE 4X4 12PLY STRL (GAUZE/BANDAGES/DRESSINGS) ×3 IMPLANT
GLOVE BIO SURGEON STRL SZ7.5 (GLOVE) ×3 IMPLANT
GLOVE BIO SURGEON STRL SZ8 (GLOVE) ×6 IMPLANT
GLOVE INDICATOR 8.0 STRL GRN (GLOVE) ×3 IMPLANT
GOWN STRL REUS W/ TWL LRG LVL3 (GOWN DISPOSABLE) ×2 IMPLANT
GOWN STRL REUS W/TWL LRG LVL3 (GOWN DISPOSABLE) ×4
KIT TURNOVER KIT A (KITS) ×3 IMPLANT
LOOP VESSEL RED MINI 1.3X0.9 (MISCELLANEOUS) ×1 IMPLANT
LOOPS RED MINI 1.3MMX0.9MM (MISCELLANEOUS) ×2
NS IRRIG 500ML POUR BTL (IV SOLUTION) ×3 IMPLANT
PACK EXTREMITY ARMC (MISCELLANEOUS) ×3 IMPLANT
PADDING CAST 2X4YD ST (MISCELLANEOUS) ×2
PADDING CAST BLEND 2X4 STRL (MISCELLANEOUS) ×1 IMPLANT
SOL PREP PVP 2OZ (MISCELLANEOUS) ×3
SOLUTION PREP PVP 2OZ (MISCELLANEOUS) ×1 IMPLANT
SPLINT CAST 1 STEP 3X12 (MISCELLANEOUS) ×3 IMPLANT
STOCKINETTE 4X48 STRL (DRAPES) ×3 IMPLANT
STRAP BODY AND KNEE 60X3 (MISCELLANEOUS) ×3 IMPLANT
SUT ETHILON 4-0 (SUTURE) ×2
SUT ETHILON 4-0 FS2 18XMFL BLK (SUTURE) ×1
SUTURE ETHLN 4-0 FS2 18XMF BLK (SUTURE) ×1 IMPLANT

## 2018-11-07 NOTE — Discharge Instructions (Signed)

## 2018-11-07 NOTE — Anesthesia Postprocedure Evaluation (Signed)
Anesthesia Post Note  Patient: Lauren Bush  Procedure(s) Performed: CARPAL TUNNEL RELEASE (Right Hand)  Patient location during evaluation: PACU Anesthesia Type: General Level of consciousness: awake and alert and oriented Pain management: satisfactory to patient Vital Signs Assessment: post-procedure vital signs reviewed and stable Respiratory status: spontaneous breathing, nonlabored ventilation and respiratory function stable Cardiovascular status: blood pressure returned to baseline and stable Postop Assessment: Adequate PO intake and No signs of nausea or vomiting Anesthetic complications: no    Cherly Beach

## 2018-11-07 NOTE — Anesthesia Procedure Notes (Signed)
Procedure Name: LMA Insertion Date/Time: 11/07/2018 8:44 AM Performed by: Maree Krabbe, CRNA Pre-anesthesia Checklist: Patient identified, Emergency Drugs available, Suction available, Timeout performed and Patient being monitored Patient Re-evaluated:Patient Re-evaluated prior to induction Oxygen Delivery Method: Circle system utilized Preoxygenation: Pre-oxygenation with 100% oxygen Induction Type: IV induction LMA: LMA inserted LMA Size: 3.0 Number of attempts: 1 Placement Confirmation: positive ETCO2 and breath sounds checked- equal and bilateral Tube secured with: Tape Dental Injury: Teeth and Oropharynx as per pre-operative assessment

## 2018-11-07 NOTE — Op Note (Signed)
DATE OF SURGERY:  11/07/2018  PATIENT NAME:  Lauren Bush   DOB: [birth year]  MRN: [id number]  PRE-OPERATIVE DIAGNOSIS: Right carpal tunnel syndrome  POST-OPERATIVE DIAGNOSIS:  Same  PROCEDURE: Right carpal tunnel release  SURGEON: Dr. Erin Sons, Montez Hageman. M.D.  ANESTHESIA: General   INDICATIONS FOR SURGERY: Lauren Bush is a 62 y.o. year old female with a long history of numbness and paresthesias in the right hand. Nerve conduction studies demonstrated findings consistent with severe median nerve compression.The patient had not seen any significant improvement despite conservative nonsurgical intervention. After discussion of the risks and benefits of surgical intervention, the patient expressed understanding of the risks benefits and agreed with plans for carpal tunnel release.   PROCEDURE IN DETAIL: The patient was taken the operating room where satisfactory general anesthesia was achieved. A tourniquet was placed on the patient's right upper arm.The right hand and arm were prepped  and draped in the usual sterile fashion. A "time-out" was performed as per usual protocol. The hand and forearm were exsanguinated using an Esmarch and the tourniquet was inflated to 250 mmHg.  An incision was made just ulnar to the thenar palmar crease. Dissection was carried down through the palmar fascia to the transverse carpal ligament. The transverse carpal ligament was sharply incised, taking care to protect the underlying structures within the carpal tunnel. Complete release of the transverse carpal ligament was achieved. There was no evidence of a mass or proliferative synovitis within the carpal tunnel. The median nerve did appear to be slightly flattened. The wound was irrigated with saline. The tourniquet was released at this time. It had been up for about 8 minutes. Bleeding was controlled with digital pressure and coagulation cautery. I did inject the subcutaneous tissue of the wound with about  9 cc of 0.5% Marcaine without epinephrine. The skin was then re-approximated with interrupted sutures of #4-0 nylon. A sterile dressing was applied followed by application of a volar splint.  The patient was awakened and transferred to a stretcher bed.  The patient tolerated the procedure well and was transported to the PACU in stable condition. Blood loss was negligible.  Dr. Isidoro Donning. M.D.

## 2018-11-07 NOTE — Anesthesia Preprocedure Evaluation (Addendum)
Anesthesia Evaluation  Patient identified by MRN, date of birth, ID band Patient awake    Reviewed: Allergy & Precautions, H&P , NPO status , Patient's Chart, lab work & pertinent test results  Airway Mallampati: II  TM Distance: >3 FB Neck ROM: full    Dental no notable dental hx.    Pulmonary former smoker,    Pulmonary exam normal breath sounds clear to auscultation       Cardiovascular hypertension, Normal cardiovascular exam Rhythm:regular Rate:Normal     Neuro/Psych    GI/Hepatic   Endo/Other    Renal/GU      Musculoskeletal   Abdominal   Peds  Hematology   Anesthesia Other Findings   Reproductive/Obstetrics                             Anesthesia Physical Anesthesia Plan  ASA: II  Anesthesia Plan: General LMA   Post-op Pain Management:    Induction:   PONV Risk Score and Plan: 3 and Midazolam, Ondansetron and Treatment may vary due to age or medical condition  Airway Management Planned:   Additional Equipment:   Intra-op Plan:   Post-operative Plan:   Informed Consent: I have reviewed the patients History and Physical, chart, labs and discussed the procedure including the risks, benefits and alternatives for the proposed anesthesia with the patient or authorized representative who has indicated his/her understanding and acceptance.     Plan Discussed with: CRNA  Anesthesia Plan Comments:         Anesthesia Quick Evaluation

## 2018-11-07 NOTE — H&P (Signed)
  H and P reviewed. No changes. Uploaded at later date. 

## 2018-11-07 NOTE — Transfer of Care (Signed)
Immediate Anesthesia Transfer of Care Note  Patient: Lauren Bush  Procedure(s) Performed: CARPAL TUNNEL RELEASE (Right Hand)  Patient Location: PACU  Anesthesia Type: General LMA  Level of Consciousness: awake, alert  and patient cooperative  Airway and Oxygen Therapy: Patient Spontanous Breathing and Patient connected to supplemental oxygen  Post-op Assessment: Post-op Vital signs reviewed, Patient's Cardiovascular Status Stable, Respiratory Function Stable, Patent Airway and No signs of Nausea or vomiting  Post-op Vital Signs: Reviewed and stable  Complications: No apparent anesthesia complications

## 2018-11-10 ENCOUNTER — Encounter: Payer: Self-pay | Admitting: Unknown Physician Specialty

## 2019-04-03 ENCOUNTER — Other Ambulatory Visit: Payer: Self-pay | Admitting: Family Medicine

## 2019-04-03 DIAGNOSIS — Z1231 Encounter for screening mammogram for malignant neoplasm of breast: Secondary | ICD-10-CM

## 2019-04-27 ENCOUNTER — Ambulatory Visit: Payer: 59

## 2019-08-10 ENCOUNTER — Encounter: Payer: Self-pay | Admitting: *Deleted

## 2019-08-10 ENCOUNTER — Other Ambulatory Visit: Payer: Self-pay

## 2019-08-13 ENCOUNTER — Other Ambulatory Visit
Admission: RE | Admit: 2019-08-13 | Discharge: 2019-08-13 | Disposition: A | Discharge status: Home / Self Care | Payer: 59 | Source: Ambulatory Visit | Attending: Ophthalmology | Admitting: Ophthalmology

## 2019-08-13 ENCOUNTER — Other Ambulatory Visit: Payer: Self-pay

## 2019-08-13 DIAGNOSIS — Z20828 Contact with and (suspected) exposure to other viral communicable diseases: Secondary | ICD-10-CM | POA: Diagnosis present

## 2019-08-13 NOTE — Discharge Instructions (Signed)

## 2019-08-14 LAB — SARS CORONAVIRUS 2 (TAT 6-24 HRS): SARS Coronavirus 2: NEGATIVE

## 2019-08-17 ENCOUNTER — Ambulatory Visit: Payer: 59 | Admitting: Anesthesiology

## 2019-08-17 ENCOUNTER — Other Ambulatory Visit: Payer: Self-pay

## 2019-08-17 ENCOUNTER — Ambulatory Visit
Admission: RE | Admit: 2019-08-17 | Discharge: 2019-08-17 | Disposition: A | Discharge status: Home / Self Care | Payer: 59 | Attending: Ophthalmology | Admitting: Ophthalmology

## 2019-08-17 ENCOUNTER — Encounter
Admission: RE | Disposition: A | Discharge status: Home / Self Care | Payer: Self-pay | Source: Home / Self Care | Attending: Ophthalmology

## 2019-08-17 DIAGNOSIS — Z79899 Other long term (current) drug therapy: Secondary | ICD-10-CM | POA: Insufficient documentation

## 2019-08-17 DIAGNOSIS — Z87891 Personal history of nicotine dependence: Secondary | ICD-10-CM | POA: Diagnosis not present

## 2019-08-17 DIAGNOSIS — H2512 Age-related nuclear cataract, left eye: Secondary | ICD-10-CM | POA: Insufficient documentation

## 2019-08-17 DIAGNOSIS — M170 Bilateral primary osteoarthritis of knee: Secondary | ICD-10-CM | POA: Diagnosis not present

## 2019-08-17 DIAGNOSIS — M19041 Primary osteoarthritis, right hand: Secondary | ICD-10-CM | POA: Diagnosis not present

## 2019-08-17 DIAGNOSIS — M19042 Primary osteoarthritis, left hand: Secondary | ICD-10-CM | POA: Insufficient documentation

## 2019-08-17 HISTORY — PX: CATARACT EXTRACTION W/PHACO: SHX586

## 2019-08-17 SURGERY — PHACOEMULSIFICATION, CATARACT, WITH IOL INSERTION
Anesthesia: Monitor Anesthesia Care | Site: Eye | Laterality: Left

## 2019-08-17 MED ORDER — SODIUM HYALURONATE 10 MG/ML IO SOLN
INTRAOCULAR | Status: DC | PRN
  Administered 2019-08-17: 0.55 mL via INTRAOCULAR

## 2019-08-17 MED ORDER — TETRACAINE HCL 0.5 % OP SOLN
1.0000 [drp] | OPHTHALMIC | Status: DC | PRN
  Administered 2019-08-17 (×3): 1 [drp] via OPHTHALMIC

## 2019-08-17 MED ORDER — SODIUM HYALURONATE 23 MG/ML IO SOLN
INTRAOCULAR | Status: DC | PRN
  Administered 2019-08-17: 0.6 mL via INTRAOCULAR

## 2019-08-17 MED ORDER — FENTANYL CITRATE (PF) 100 MCG/2ML IJ SOLN
INTRAMUSCULAR | Status: DC | PRN
  Administered 2019-08-17 (×2): 50 ug via INTRAVENOUS

## 2019-08-17 MED ORDER — ARMC OPHTHALMIC DILATING DROPS
1.0000 "application " | OPHTHALMIC | Status: DC | PRN
  Administered 2019-08-17 (×3): 1 via OPHTHALMIC

## 2019-08-17 MED ORDER — LIDOCAINE HCL (PF) 2 % IJ SOLN
INTRAOCULAR | Status: DC | PRN
  Administered 2019-08-17: 1 mL via INTRAOCULAR

## 2019-08-17 MED ORDER — MIDAZOLAM HCL 2 MG/2ML IJ SOLN
INTRAMUSCULAR | Status: DC | PRN
  Administered 2019-08-17: 2 mg via INTRAVENOUS

## 2019-08-17 MED ORDER — ACETAMINOPHEN 160 MG/5ML PO SOLN: 325.0000 mg | ORAL | Status: DC | PRN

## 2019-08-17 MED ORDER — ACETAMINOPHEN 325 MG PO TABS: 325.0000 mg | ORAL_TABLET | ORAL | Status: DC | PRN

## 2019-08-17 MED ORDER — MOXIFLOXACIN HCL 0.5 % OP SOLN
OPHTHALMIC | Status: DC | PRN
  Administered 2019-08-17: 0.2 mL via OPHTHALMIC

## 2019-08-17 MED ORDER — EPINEPHRINE PF 1 MG/ML IJ SOLN
INTRAOCULAR | Status: DC | PRN
  Administered 2019-08-17: 12:00:00 60 mL via OPHTHALMIC

## 2019-08-17 SURGICAL SUPPLY — 19 items
CANNULA ANT/CHMB 27G (MISCELLANEOUS) ×2 IMPLANT
CANNULA ANT/CHMB 27GA (MISCELLANEOUS) ×6 IMPLANT
DISSECTOR HYDRO NUCLEUS 50X22 (MISCELLANEOUS) ×3 IMPLANT
GLOVE SURG LX 7.5 STRW (GLOVE) ×2
GLOVE SURG LX STRL 7.5 STRW (GLOVE) ×1 IMPLANT
GLOVE SURG SYN 8.5  E (GLOVE) ×2
GLOVE SURG SYN 8.5 E (GLOVE) ×1 IMPLANT
GLOVE SURG SYN 8.5 PF PI (GLOVE) ×1 IMPLANT
GOWN STRL REUS W/ TWL LRG LVL3 (GOWN DISPOSABLE) ×2 IMPLANT
GOWN STRL REUS W/TWL LRG LVL3 (GOWN DISPOSABLE) ×4
LENS IOL TECNIS ITEC 16.5 (Intraocular Lens) ×2 IMPLANT
MARKER SKIN DUAL TIP RULER LAB (MISCELLANEOUS) ×3 IMPLANT
PACK DR. KING ARMS (PACKS) ×3 IMPLANT
PACK EYE AFTER SURG (MISCELLANEOUS) ×3 IMPLANT
PACK OPTHALMIC (MISCELLANEOUS) ×3 IMPLANT
SYR 3ML LL SCALE MARK (SYRINGE) ×3 IMPLANT
SYR TB 1ML LUER SLIP (SYRINGE) ×3 IMPLANT
WATER STERILE IRR 250ML POUR (IV SOLUTION) ×3 IMPLANT
WIPE NON LINTING 3.25X3.25 (MISCELLANEOUS) ×3 IMPLANT

## 2019-08-17 NOTE — Anesthesia Preprocedure Evaluation (Signed)
Anesthesia Evaluation  Patient identified by MRN, date of birth, ID band Patient awake    Reviewed: Allergy & Precautions, H&P , NPO status , Patient's Chart, lab work & pertinent test results, reviewed documented beta blocker date and time   Airway Mallampati: II  TM Distance: >3 FB Neck ROM: full    Dental no notable dental hx.    Pulmonary former smoker,    Pulmonary exam normal breath sounds clear to auscultation       Cardiovascular Exercise Tolerance: Good hypertension,  Rhythm:regular Rate:Normal     Neuro/Psych negative neurological ROS  negative psych ROS   GI/Hepatic negative GI ROS, Neg liver ROS,   Endo/Other  negative endocrine ROS  Renal/GU negative Renal ROS  negative genitourinary   Musculoskeletal   Abdominal   Peds  Hematology negative hematology ROS (+)   Anesthesia Other Findings   Reproductive/Obstetrics negative OB ROS                             Anesthesia Physical Anesthesia Plan  ASA: II  Anesthesia Plan: MAC   Post-op Pain Management:    Induction:   PONV Risk Score and Plan:   Airway Management Planned:   Additional Equipment:   Intra-op Plan:   Post-operative Plan:   Informed Consent: I have reviewed the patients History and Physical, chart, labs and discussed the procedure including the risks, benefits and alternatives for the proposed anesthesia with the patient or authorized representative who has indicated his/her understanding and acceptance.     Dental Advisory Given  Plan Discussed with: CRNA  Anesthesia Plan Comments:         Anesthesia Quick Evaluation

## 2019-08-17 NOTE — Anesthesia Postprocedure Evaluation (Signed)
Anesthesia Post Note  Patient: Lauren Bush  Procedure(s) Performed: CATARACT EXTRACTION PHACO AND INTRAOCULAR LENS PLACEMENT ([facility]) LEFT  00:30.8  10.4%  3.30 (Left Eye)  Patient location during evaluation: PACU Anesthesia Type: MAC Level of consciousness: awake and alert Pain management: pain level controlled Vital Signs Assessment: post-procedure vital signs reviewed and stable Respiratory status: spontaneous breathing, nonlabored ventilation, respiratory function stable and patient connected to nasal cannula oxygen Cardiovascular status: stable and blood pressure returned to baseline Postop Assessment: no apparent nausea or vomiting Anesthetic complications: no    Alisa Graff

## 2019-08-17 NOTE — Transfer of Care (Signed)
Immediate Anesthesia Transfer of Care Note  Patient: Lauren Bush  Procedure(s) Performed: CATARACT EXTRACTION PHACO AND INTRAOCULAR LENS PLACEMENT ([facility]) LEFT  00:30.8  10.4%  3.30 (Left Eye)  Patient Location: PACU  Anesthesia Type: MAC  Level of Consciousness: awake, alert  and patient cooperative  Airway and Oxygen Therapy: Patient Spontanous Breathing and Patient connected to supplemental oxygen  Post-op Assessment: Post-op Vital signs reviewed, Patient's Cardiovascular Status Stable, Respiratory Function Stable, Patent Airway and No signs of Nausea or vomiting  Post-op Vital Signs: Reviewed and stable  Complications: No apparent anesthesia complications

## 2019-08-17 NOTE — Op Note (Signed)
OPERATIVE NOTE  Martha Ellerby [id number] 08/17/2019   PREOPERATIVE DIAGNOSIS:  Nuclear sclerotic cataract left eye.  H25.12   POSTOPERATIVE DIAGNOSIS:    Nuclear sclerotic cataract left eye.     PROCEDURE:  Phacoemusification with posterior chamber intraocular lens placement of the left eye   LENS:   Implant Name Type Inv. Item Serial No. Manufacturer Lot No. LRB No. Used Action  LENS IOL DIOP 16.5 - [id number] Intraocular Lens LENS IOL DIOP 16.5 [id number] AMO  Left 1 Implanted      Procedure(s): CATARACT EXTRACTION PHACO AND INTRAOCULAR LENS PLACEMENT ([facility]) LEFT  00:30.8  10.4%  3.30 (Left)  PCB00 +16.5   SURGEON:  Benay Pillow, MD, MPH   ANESTHESIA:  Topical with tetracaine drops augmented with 1% preservative-free intracameral lidocaine.  ESTIMATED BLOOD LOSS: <1 mL   COMPLICATIONS:  None.   DESCRIPTION OF PROCEDURE:  The patient was identified in the holding room and transported to the operating room and placed in the supine position under the operating microscope.  The left eye was identified as the operative eye and it was prepped and draped in the usual sterile ophthalmic fashion.   A 1.0 millimeter clear-corneal paracentesis was made at the 5:00 position. 0.5 ml of preservative-free 1% lidocaine with epinephrine was injected into the anterior chamber.  The anterior chamber was filled with Healon 5 viscoelastic.  A 2.4 millimeter keratome was used to make a near-clear corneal incision at the 2:00 position.  A curvilinear capsulorrhexis was made with a cystotome and capsulorrhexis forceps.  Balanced salt solution was used to hydrodissect and hydrodelineate the nucleus.   Phacoemulsification was then used in stop and chop fashion to remove the lens nucleus and epinucleus.  The remaining cortex was then removed using the irrigation and aspiration handpiece. Healon was then placed into the capsular bag to distend it for lens placement.  A lens was then injected into the  capsular bag.  The remaining viscoelastic was aspirated.   Wounds were hydrated with balanced salt solution.  The anterior chamber was inflated to a physiologic pressure with balanced salt solution.  Intracameral vigamox 0.1 mL undiltued was injected into the eye and a drop placed onto the ocular surface.  No wound leaks were noted.  The patient was taken to the recovery room in stable condition without complications of anesthesia or surgery  Benay Pillow 08/17/2019, 11:58 AM

## 2019-08-17 NOTE — H&P (Signed)

## 2019-08-18 ENCOUNTER — Encounter: Payer: Self-pay | Admitting: Ophthalmology

## 2019-12-22 ENCOUNTER — Ambulatory Visit: Payer: 59 | Admitting: Obstetrics and Gynecology

## 2019-12-23 ENCOUNTER — Ambulatory Visit (INDEPENDENT_AMBULATORY_CARE_PROVIDER_SITE_OTHER): Payer: BC Managed Care – PPO | Admitting: Obstetrics and Gynecology

## 2019-12-23 ENCOUNTER — Other Ambulatory Visit: Payer: Self-pay

## 2019-12-23 ENCOUNTER — Encounter: Payer: Self-pay | Admitting: Obstetrics and Gynecology

## 2019-12-23 VITALS — BP 128/84 | Ht 63.0 in | Wt 168.0 lb

## 2019-12-23 DIAGNOSIS — Z01419 Encounter for gynecological examination (general) (routine) without abnormal findings: Secondary | ICD-10-CM

## 2019-12-23 DIAGNOSIS — Z1331 Encounter for screening for depression: Secondary | ICD-10-CM

## 2019-12-23 DIAGNOSIS — Z1339 Encounter for screening examination for other mental health and behavioral disorders: Secondary | ICD-10-CM

## 2019-12-23 DIAGNOSIS — Z1239 Encounter for other screening for malignant neoplasm of breast: Secondary | ICD-10-CM

## 2019-12-23 NOTE — Progress Notes (Signed)
Routine Annual Gynecology Examination   PCP: Elaina Pattee, MD  Chief Complaint  Patient presents with  . Annual Exam   History of Present Illness: Patient is a 64 y.o. [id number] presents for annual exam. The patient has no complaints today.   Menopausal bleeding: denies  Menopausal symptoms: denies  Breast symptoms: denies  Last pap smear: 2 years ago.  Result Normal  Last mammogram: 1.5 years ago.  Result Normal  Last colonoscopy: 1 year. normal  Patient is sexually active: she denies issues with intercourse.  She always wears a seatbelt in a car.    Past Medical History:  Diagnosis Date  . Arthritis    hands, knees  . Hypertension     Past Surgical History:  Procedure Laterality Date  . CARPAL TUNNEL RELEASE Right 11/07/2018   Procedure: CARPAL TUNNEL RELEASE;  Surgeon: Leanor Kail, MD;  Location: [facility];  Service: Orthopedics;  Laterality: Right;  . CATARACT EXTRACTION W/PHACO Right 09/02/2018   Procedure: CATARACT EXTRACTION PHACO AND INTRAOCULAR LENS PLACEMENT ([facility])  RIGHT;  Surgeon: Eulogio Bear, MD;  Location: [facility];  Service: Ophthalmology;  Laterality: Right;  . CATARACT EXTRACTION W/PHACO Left 08/17/2019   Procedure: CATARACT EXTRACTION PHACO AND INTRAOCULAR LENS PLACEMENT ([facility]) LEFT  00:30.8  10.4%  3.30;  Surgeon: Eulogio Bear, MD;  Location: [facility];  Service: Ophthalmology;  Laterality: Left;  . COLONOSCOPY    . FOOT SURGERY Right     Prior to Admission medications   Medication Sig Start Date End Date Taking? Authorizing Provider  ALPRAZolam Duanne Moron) 0.5 MG tablet Take 0.5 mg by mouth at bedtime as needed for anxiety.   Yes [provider]  CALCIUM PO Take by mouth daily.   Yes [provider]  gabapentin (NEURONTIN) 300 MG capsule Take 300 mg by mouth 2 (two) times daily. 2 tabs AM, 1 tab PM   Yes [provider]  lisinopril (PRINIVIL,ZESTRIL) 20 MG tablet Take  20 mg by mouth daily.   Yes [provider]  Multiple Vitamins-Minerals (CENTRUM SILVER 50+WOMEN PO) Take by mouth daily.   Yes [provider]  DULoxetine (CYMBALTA) 60 MG capsule Take 90 mg by mouth daily.     [provider]  fexofenadine (ALLEGRA) 180 MG tablet Take 180 mg by mouth daily as needed for allergies or rhinitis.    [provider]  methylphenidate (CONCERTA) 54 MG PO CR tablet Take 30 mg by mouth every morning.     [provider]   Allergies: No Known Allergies  Obstetric History: [id number]  Social History   Socioeconomic History  . Marital status: Divorced    Spouse name: Not on file  . Number of children: Not on file  . Years of education: Not on file  . Highest education level: Not on file  Occupational History  . Not on file  Tobacco Use  . Smoking status: Former Smoker    Years: 15.00    Types: Cigarettes    Quit date: 11/2013    Years since quitting: 6.1  . Smokeless tobacco: Never Used  . Tobacco comment: smoked socially  Substance and Sexual Activity  . Alcohol use: Not Currently    Alcohol/week: 0.0 standard drinks    Comment: Quit 09/14/2018  . Drug use: No  . Sexual activity: Yes    Birth control/protection: None  Other Topics Concern  . Not on file  Social History Narrative  . Not on file  Social Determinants of Health   Financial Resource Strain:   . Difficulty of Paying Living Expenses: Not on file  Food Insecurity:   . Worried About Programme researcher, broadcasting/film/video in the Last Year: Not on file  . Ran Out of Food in the Last Year: Not on file  Transportation Needs:   . Lack of Transportation (Medical): Not on file  . Lack of Transportation (Non-Medical): Not on file  Physical Activity:   . Days of Exercise per Week: Not on file  . Minutes of Exercise per Session: Not on file  Stress:   . Feeling of Stress : Not on file  Social Connections:   . Frequency of Communication with Friends and Family: Not  on file  . Frequency of Social Gatherings with Friends and Family: Not on file  . Attends Religious Services: Not on file  . Active Member of Clubs or Organizations: Not on file  . Attends Banker Meetings: Not on file  . Marital Status: Not on file  Intimate Partner Violence:   . Fear of Current or Ex-Partner: Not on file  . Emotionally Abused: Not on file  . Physically Abused: Not on file  . Sexually Abused: Not on file    Family History  Problem Relation Age of Onset  . Polymyositis Mother   . CAD Father   . Breast cancer Neg Hx     Review of Systems  Constitutional: Negative.   HENT: Negative.   Eyes: Negative.   Respiratory: Negative.   Cardiovascular: Negative.   Gastrointestinal: Negative.   Genitourinary: Negative.   Musculoskeletal: Negative.   Skin: Negative.   Neurological: Negative.   Psychiatric/Behavioral: Negative.      Physical Exam Vitals: BP 128/84   Ht 5\' 3"  (1.6 m)   Wt 168 lb (76.2 kg)   BMI 29.76 kg/m   Physical Exam Constitutional:      General: She is not in acute distress.    Appearance: Normal appearance. She is well-developed.  Genitourinary:     Pelvic exam was performed with patient supine.     Vulva, urethra, bladder and uterus normal.     No inguinal adenopathy present in the right or left side.    No signs of injury in the vagina.     No vaginal discharge, erythema, tenderness or bleeding.     No cervical motion tenderness, discharge, lesion or polyp.     Uterus is mobile.     Uterus is not enlarged or tender.     No uterine mass detected.    Uterus is anteverted.     No right or left adnexal mass present.     Right adnexa not tender or full.     Left adnexa not tender or full.  HENT:     Head: Normocephalic and atraumatic.  Eyes:     General: No scleral icterus.    Conjunctiva/sclera: Conjunctivae normal.  Neck:     Thyroid: No thyromegaly.  Cardiovascular:     Rate and Rhythm: Normal rate and regular  rhythm.     Heart sounds: No murmur. No friction rub. No gallop.   Pulmonary:     Effort: Pulmonary effort is normal. No respiratory distress.     Breath sounds: Normal breath sounds. No wheezing or rales.  Chest:     Breasts:        Right: No inverted nipple, mass, nipple discharge, skin change or tenderness.        Left:  No inverted nipple, mass, nipple discharge, skin change or tenderness.  Abdominal:     General: Bowel sounds are normal. There is no distension.     Palpations: Abdomen is soft. There is no mass.     Tenderness: There is no abdominal tenderness. There is no guarding or rebound.  Musculoskeletal:        General: No swelling or tenderness. Normal range of motion.     Cervical back: Normal range of motion and neck supple.  Lymphadenopathy:     Cervical: No cervical adenopathy.     Lower Body: No right inguinal adenopathy. No left inguinal adenopathy.  Neurological:     General: No focal deficit present.     Mental Status: She is alert and oriented to person, place, and time.     Cranial Nerves: No cranial nerve deficit.  Skin:    General: Skin is warm and dry.     Findings: No erythema or rash.  Psychiatric:        Mood and Affect: Mood normal.        Behavior: Behavior normal.        Judgment: Judgment normal.      Female chaperone present for pelvic and breast  portions of the physical exam  Results: AUDIT Questionnaire (screen for alcoholism): 5 PHQ-9: 2   Assessment and Plan:  64 y.o. [id number] female here for routine annual gynecologic examination  Plan: Problem List Items Addressed This Visit    None    Visit Diagnoses    Women's annual routine gynecological examination    -  Primary   Screening for depression       Screening for alcoholism          Screening: -- Blood pressure screen managed by PCP -- Colonoscopy - not due -- Mammogram - due. Patient to call [facility] to arrange. She understands that it is her responsibility to arrange  this. -- Weight screening: overweight: continue to monitor -- Depression screening negative (PHQ-9) -- Nutrition: normal -- cholesterol screening: per PCP -- osteoporosis screening: not due -- tobacco screening: not using -- alcohol screening: AUDIT questionnaire indicates low-risk usage. -- family history of breast cancer screening: done. not at high risk. -- no evidence of domestic violence or intimate partner violence. -- STD screening: gonorrhea/chlamydia NAAT not collected per patient request. -- pap smear not collected per ASCCP guidelines -- flu vaccine received in 09/2019 -- HPV vaccination series: not eligilbe   Thomasene Mohair, MD 12/23/2019 9:42 AM

## 2019-12-24 ENCOUNTER — Other Ambulatory Visit: Payer: Self-pay | Admitting: Obstetrics and Gynecology

## 2019-12-24 DIAGNOSIS — Z1231 Encounter for screening mammogram for malignant neoplasm of breast: Secondary | ICD-10-CM

## 2020-01-13 ENCOUNTER — Ambulatory Visit: Payer: 59

## 2020-01-30 ENCOUNTER — Ambulatory Visit: Payer: Self-pay | Attending: Internal Medicine

## 2020-01-30 DIAGNOSIS — Z23 Encounter for immunization: Secondary | ICD-10-CM

## 2020-01-30 NOTE — Progress Notes (Signed)
   Covid-19 Vaccination Clinic  Name:  Lauren Bush    MRN: [id number] DOB: [birth year]  01/30/2020  Ms. [name] was observed post Covid-19 immunization for 15 minutes without incident. She was provided with Vaccine Information Sheet and instruction to access the V-Safe system.   Ms. [name] was instructed to call 911 with any severe reactions post vaccine: Marland Kitchen Difficulty breathing  . Swelling of face and throat  . A fast heartbeat  . A bad rash all over body  . Dizziness and weakness   Immunizations Administered    Name Date Dose VIS Date Route   Pfizer COVID-19 Vaccine 01/30/2020  1:58 PM 0.3 mL 10/23/2019 Intramuscular   Manufacturer: [employer], [employer]   Lot: [id number]   NDC: [address]-1

## 2020-02-01 ENCOUNTER — Other Ambulatory Visit: Payer: Self-pay

## 2020-02-01 ENCOUNTER — Ambulatory Visit
Admission: RE | Admit: 2020-02-01 | Discharge: 2020-02-01 | Disposition: A | Discharge status: Home / Self Care | Payer: BC Managed Care – PPO | Source: Ambulatory Visit | Attending: Obstetrics and Gynecology | Admitting: Obstetrics and Gynecology

## 2020-02-01 DIAGNOSIS — Z1231 Encounter for screening mammogram for malignant neoplasm of breast: Secondary | ICD-10-CM | POA: Insufficient documentation

## 2020-03-01 ENCOUNTER — Ambulatory Visit: Payer: Self-pay | Attending: Internal Medicine

## 2020-03-01 DIAGNOSIS — Z23 Encounter for immunization: Secondary | ICD-10-CM

## 2020-03-01 NOTE — Progress Notes (Signed)
   Covid-19 Vaccination Clinic  Name:  Delsie Amador    MRN: [id number] DOB: [birth year]  03/01/2020  Ms. Schwenn was observed post Covid-19 immunization for 15 minutes without incident. She was provided with Vaccine Information Sheet and instruction to access the V-Safe system.   Ms. Dowe was instructed to call 911 with any severe reactions post vaccine: Marland Kitchen Difficulty breathing  . Swelling of face and throat  . A fast heartbeat  . A bad rash all over body  . Dizziness and weakness   Immunizations Administered    Name Date Dose VIS Date Route   Pfizer COVID-19 Vaccine 03/01/2020 10:10 AM 0.3 mL 01/06/2019 Intramuscular   Manufacturer: [employer], [employer]   Lot: [id number]   NDC: [address]-2

## 2020-03-29 ENCOUNTER — Other Ambulatory Visit: Payer: Self-pay | Admitting: Family Medicine

## 2020-03-29 DIAGNOSIS — R9389 Abnormal findings on diagnostic imaging of other specified body structures: Secondary | ICD-10-CM

## 2020-04-05 ENCOUNTER — Ambulatory Visit
Admission: RE | Admit: 2020-04-05 | Discharge: 2020-04-05 | Disposition: A | Discharge status: Home / Self Care | Payer: No Typology Code available for payment source | Attending: Family Medicine | Admitting: Family Medicine

## 2020-04-05 ENCOUNTER — Other Ambulatory Visit: Payer: Self-pay

## 2020-04-05 ENCOUNTER — Ambulatory Visit
Admission: RE | Admit: 2020-04-05 | Discharge: 2020-04-05 | Disposition: A | Discharge status: Home / Self Care | Payer: No Typology Code available for payment source | Source: Ambulatory Visit | Attending: Family Medicine | Admitting: Family Medicine

## 2020-04-05 DIAGNOSIS — R9389 Abnormal findings on diagnostic imaging of other specified body structures: Secondary | ICD-10-CM | POA: Insufficient documentation

## 2021-04-17 ENCOUNTER — Encounter: Payer: Self-pay | Admitting: Obstetrics and Gynecology

## 2021-04-17 ENCOUNTER — Other Ambulatory Visit (HOSPITAL_COMMUNITY)
Admission: RE | Admit: 2021-04-17 | Discharge: 2021-04-17 | Disposition: A | Discharge status: Home / Self Care | Payer: No Typology Code available for payment source | Source: Ambulatory Visit | Attending: Obstetrics and Gynecology | Admitting: Obstetrics and Gynecology

## 2021-04-17 ENCOUNTER — Other Ambulatory Visit: Payer: Self-pay

## 2021-04-17 ENCOUNTER — Ambulatory Visit (INDEPENDENT_AMBULATORY_CARE_PROVIDER_SITE_OTHER): Payer: Medicare HMO | Admitting: Obstetrics and Gynecology

## 2021-04-17 VITALS — BP 120/70 | Ht 63.0 in | Wt 163.0 lb

## 2021-04-17 DIAGNOSIS — Z124 Encounter for screening for malignant neoplasm of cervix: Secondary | ICD-10-CM

## 2021-04-17 DIAGNOSIS — Z01419 Encounter for gynecological examination (general) (routine) without abnormal findings: Secondary | ICD-10-CM | POA: Diagnosis not present

## 2021-04-17 DIAGNOSIS — Z1339 Encounter for screening examination for other mental health and behavioral disorders: Secondary | ICD-10-CM

## 2021-04-17 DIAGNOSIS — Z1331 Encounter for screening for depression: Secondary | ICD-10-CM

## 2021-04-17 NOTE — Progress Notes (Signed)
Routine Annual Gynecology Examination   PCP: Garry Heater, MD  Chief Complaint  Patient presents with  . Gynecologic Exam    No concerns   History of Present Illness: Patient is a 65 y.o. Lauren Bush presents for annual exam. The patient has no complaints today.   Menopausal bleeding: denies  Menopausal symptoms: denies  Breast symptoms: denies  Last pap smear: 3 years ago.  Result Normal  Last mammogram: 1 years ago.  Result Normal  Last colonoscopy: 2 year. normal  Patient is sexually active: she denies issues with intercourse.  She always wears a seatbelt in a car.    Past Medical History:  Diagnosis Date  . Arthritis    hands, knees  . Hypertension     Past Surgical History:  Procedure Laterality Date  . CARPAL TUNNEL RELEASE Right 11/07/2018   Procedure: CARPAL TUNNEL RELEASE;  Surgeon: Erin Sons, MD;  Location: [facility];  Service: Orthopedics;  Laterality: Right;  . CATARACT EXTRACTION W/PHACO Right 09/02/2018   Procedure: CATARACT EXTRACTION PHACO AND INTRAOCULAR LENS PLACEMENT (IOC)  RIGHT;  Surgeon: Nevada Crane, MD;  Location: [facility];  Service: Ophthalmology;  Laterality: Right;  . CATARACT EXTRACTION W/PHACO Left 08/17/2019   Procedure: CATARACT EXTRACTION PHACO AND INTRAOCULAR LENS PLACEMENT (IOC) LEFT  00:30.8  10.4%  3.30;  Surgeon: Nevada Crane, MD;  Location: [facility];  Service: Ophthalmology;  Laterality: Left;  . COLONOSCOPY    . FOOT SURGERY Right     Prior to Admission medications   Medication Sig Start Date End Date Taking? Authorizing Provider  ALPRAZolam Prudy Feeler) 0.5 MG tablet Take 0.5 mg by mouth at bedtime as needed for anxiety.   Yes [provider]  CALCIUM PO Take by mouth daily.   Yes [provider]  gabapentin (NEURONTIN) 300 MG capsule Take 300 mg by mouth 2 (two) times daily. 2 tabs AM, 1 tab PM   Yes [provider]  lisinopril (PRINIVIL,ZESTRIL)  20 MG tablet Take 20 mg by mouth daily.   Yes [provider]  Multiple Vitamins-Minerals (CENTRUM SILVER 50+WOMEN PO) Take by mouth daily.   Yes [provider]  DULoxetine (CYMBALTA) 60 MG capsule Take 90 mg by mouth daily.     [provider]  fexofenadine (ALLEGRA) 180 MG tablet Take 180 mg by mouth daily as needed for allergies or rhinitis.    [provider]  methylphenidate (CONCERTA) 54 MG PO CR tablet Take 30 mg by mouth every morning.     [provider]   Allergies: No Known Allergies  Obstetric History: [id number]  Social History   Socioeconomic History  . Marital status: Divorced    Spouse name: Not on file  . Number of children: Not on file  . Years of education: Not on file  . Highest education level: Not on file  Occupational History  . Not on file  Tobacco Use  . Smoking status: Former Smoker    Years: 15.00    Types: Cigarettes    Quit date: 11/2013    Years since quitting: 7.4  . Smokeless tobacco: Never Used  . Tobacco comment: smoked socially  Vaping Use  . Vaping Use: Never used  Substance and Sexual Activity  . Alcohol use: Not Currently    Alcohol/week: 0.0 standard drinks    Comment: Quit 09/14/2018  . Drug use: No  . Sexual activity: Not Currently    Birth control/protection: Post-menopausal  Other Topics Concern  .  Not on file  Social History Narrative  . Not on file   Social Determinants of Health   Financial Resource Strain: Not on file  Food Insecurity: Not on file  Transportation Needs: Not on file  Physical Activity: Not on file  Stress: Not on file  Social Connections: Not on file  Intimate Partner Violence: Not on file    Family History  Problem Relation Age of Onset  . Polymyositis Mother   . CAD Father   . Breast cancer Neg Hx     Review of Systems  Constitutional: Negative.   HENT: Negative.   Eyes: Negative.   Respiratory: Negative.   Cardiovascular: Negative.    Gastrointestinal: Negative.   Genitourinary: Negative.   Musculoskeletal: Negative.   Skin: Negative.   Neurological: Negative.   Psychiatric/Behavioral: Negative.      Physical Exam Vitals: BP 120/70   Ht 5\' 3"  (1.6 m)   Wt 163 lb (73.9 kg)   BMI 28.87 kg/m   Physical Exam Constitutional:      General: She is not in acute distress.    Appearance: Normal appearance. She is well-developed.  Genitourinary:     Vulva and bladder normal.     No vaginal discharge, erythema, tenderness or bleeding.      Right Adnexa: not tender, not full and no mass present.    Left Adnexa: not tender, not full and no mass present.    No cervical motion tenderness, discharge, lesion or polyp.     Uterus is not enlarged or tender.     No uterine mass detected. Breasts:     Right: No inverted nipple, mass, nipple discharge, skin change or tenderness.     Left: No inverted nipple, mass, nipple discharge, skin change or tenderness.    HENT:     Head: Normocephalic and atraumatic.  Neck:     Thyroid: No thyromegaly.  Pulmonary:     Effort: No respiratory distress.  Abdominal:     Tenderness: There is no abdominal tenderness. There is no guarding or rebound.  Lymphadenopathy:     Lower Body: No right inguinal adenopathy. No left inguinal adenopathy.  Neurological:     Mental Status: She is alert.     Cranial Nerves: No cranial nerve deficit.  Skin:    General: Skin is warm and dry.     Findings: No erythema or rash.     Female chaperone present for pelvic and breast  portions of the physical exam  Results: AUDIT Questionnaire (screen for alcoholism): 4 PHQ-9: 4   Assessment and Plan:  65 y.o. 76 female here for routine annual gynecologic examination  Plan: Problem List Items Addressed This Visit   None   Visit Diagnoses    Women's annual routine gynecological examination    -  Primary   Relevant Orders   Cytology - PAP   Pap smear for cervical cancer screening        Relevant Orders   Cytology - PAP   Screening for alcoholism       Screening for depression          Screening: -- Blood pressure screen managed by PCP -- Colonoscopy - not due -- Mammogram - due. Patient to call [facility] to arrange. She understands that it is her responsibility to arrange this. -- Depression screening negative (PHQ-9) -- alcohol screening: AUDIT questionnaire indicates low-risk usage. -- family history of breast cancer screening: done. not at high risk. -- STD screening: gonorrhea/chlamydia NAAT not  collected per patient request. -- pap smear collected  -- flu vaccine received in 09/2019 -- HPV vaccination series: not eligilbe   Thomasene Mohair, MD\ 04/17/2021 10:58 AM

## 2021-04-19 LAB — CYTOLOGY - PAP: Diagnosis: NEGATIVE

## 2022-02-09 ENCOUNTER — Other Ambulatory Visit: Payer: Self-pay | Admitting: Family Medicine

## 2022-02-09 DIAGNOSIS — Z780 Asymptomatic menopausal state: Secondary | ICD-10-CM

## 2022-03-05 ENCOUNTER — Ambulatory Visit
Admission: RE | Admit: 2022-03-05 | Discharge: 2022-03-05 | Disposition: A | Discharge status: Home / Self Care | Payer: Medicare HMO | Source: Ambulatory Visit | Attending: Family Medicine | Admitting: Family Medicine

## 2022-03-05 ENCOUNTER — Other Ambulatory Visit: Payer: No Typology Code available for payment source

## 2022-03-05 DIAGNOSIS — Z780 Asymptomatic menopausal state: Secondary | ICD-10-CM | POA: Insufficient documentation

## 2022-04-25 ENCOUNTER — Ambulatory Visit: Payer: Medicare HMO | Admitting: Obstetrics and Gynecology

## 2022-04-26 ENCOUNTER — Encounter: Payer: Self-pay | Admitting: Obstetrics

## 2022-04-26 ENCOUNTER — Ambulatory Visit (INDEPENDENT_AMBULATORY_CARE_PROVIDER_SITE_OTHER): Payer: Medicare HMO | Admitting: Obstetrics

## 2022-04-26 VITALS — BP 122/70 | Ht 62.0 in | Wt 157.8 lb

## 2022-04-26 DIAGNOSIS — N898 Other specified noninflammatory disorders of vagina: Secondary | ICD-10-CM

## 2022-04-26 DIAGNOSIS — Z1231 Encounter for screening mammogram for malignant neoplasm of breast: Secondary | ICD-10-CM

## 2022-04-26 DIAGNOSIS — Z01411 Encounter for gynecological examination (general) (routine) with abnormal findings: Secondary | ICD-10-CM | POA: Diagnosis not present

## 2022-04-26 DIAGNOSIS — M069 Rheumatoid arthritis, unspecified: Secondary | ICD-10-CM

## 2022-04-26 DIAGNOSIS — Z87891 Personal history of nicotine dependence: Secondary | ICD-10-CM

## 2022-04-26 DIAGNOSIS — M8580 Other specified disorders of bone density and structure, unspecified site: Secondary | ICD-10-CM

## 2022-04-26 DIAGNOSIS — R059 Cough, unspecified: Secondary | ICD-10-CM

## 2022-04-26 NOTE — Patient Instructions (Signed)
Have a great year! Please call with any concerns. Don't forget to wear your seatbelt everyday! If you are not signed up on MyChart, please ask [place] how to sign up for it!   In a world where you can be anything, please be kind.   Body mass index is 28.86 kg/m.  A Healthy Lifestyle: Care Instructions Your Care Instructions  A healthy lifestyle can help you feel good, stay at a healthy weight, and have plenty of energy for both work and play. A healthy lifestyle is something you can share with your whole family. A healthy lifestyle also can lower your risk for serious health problems, such as high blood pressure, heart disease, and diabetes. You can follow a few steps listed below to improve your health and the health of your family. Follow-up care is a key part of your treatment and safety. Be sure to make and go to all appointments, and call your doctor if you are having problems. It's also a good idea to know your test results and keep a list of the medicines you take. How can you care for yourself at home? Do not eat too much sugar, fat, or fast foods. You can still have dessert and treats now and then. The goal is moderation. Start small to improve your eating habits. Pay attention to portion sizes, drink less juice and soda pop, and eat more fruits and vegetables. Eat a healthy amount of food. A 3-ounce serving of meat, for example, is about the size of a deck of cards. Fill the rest of your plate with vegetables and whole grains. Limit the amount of soda and sports drinks you have every day. Drink more water when you are thirsty. Eat at least 5 servings of fruits and vegetables every day. It may seem like a lot, but it is not hard to reach this goal. A serving or helping is 1 piece of fruit, 1 cup of vegetables, or 2 cups of leafy, raw vegetables. Have an apple or some carrot sticks as an afternoon snack instead of a candy bar. Try to have fruits and/or vegetables at every meal. Make exercise  part of your daily routine. You may want to start with simple activities, such as walking, bicycling, or slow swimming. Try to be active 30 to 60 minutes every day. You do not need to do all 30 to 60 minutes all at once. For example, you can exercise 3 times a day for 10 or 20 minutes. Moderate exercise is safe for most people, but it is always a good idea to talk to your doctor before starting an exercise program. Keep moving. Mow the lawn, work in the garden, or [employer]. Take the stairs instead of the elevator at work. If you smoke, quit. People who smoke have an increased risk for heart attack, stroke, cancer, and other lung illnesses. Quitting is hard, but there are ways to boost your chance of quitting tobacco for good. Use nicotine gum, patches, or lozenges. Ask your doctor about stop-smoking programs and medicines. Keep trying. In addition to reducing your risk of diseases in the future, you will notice some benefits soon after you stop using tobacco. If you have shortness of breath or asthma symptoms, they will likely get better within a few weeks after you quit. Limit how much alcohol you drink. Moderate amounts of alcohol (up to 2 drinks a day for men, 1 drink a day for women) are okay. But drinking too much can lead to  liver problems, high blood pressure, and other health problems. Family health If you have a family, there are many things you can do together to improve your health. Eat meals together as a family as often as possible. Eat healthy foods. This includes fruits, vegetables, lean meats and dairy, and whole grains. Include your family in your fitness plan. Most people think of activities such as jogging or tennis as the way to fitness, but there are many ways you and your family can be more active. Anything that makes you breathe hard and gets your heart pumping is exercise. Here are some tips: Walk to do errands or to take your child to school or the bus. Go for a family  bike ride after dinner instead of watching TV. Care instructions adapted under license by your healthcare professional. This care instruction is for use with your licensed healthcare professional. If you have questions about a medical condition or this instruction, always ask your healthcare professional. [place] any warranty or liability for your use of this information.

## 2022-04-26 NOTE — Progress Notes (Signed)
Chief Complaint  Patient presents with   Annual Exam    Patient comes in office today for her annual physical and would like to address concern of vaginal discharge that has been present 2 months or more. Patient reports that discharge is thick and white; patient denies vaginal itching or irritation.    Patient Lauren Bush is an 66 y.o. year old [id number] menopausal patient  No LMP recorded. Patient is postmenopausal. s who presents for annual. She is exercising regularly. She does perform self breast exam. She takes multivitamin, calcium and Vitamin D. She denies vasomotor symptoms, or vaginal dryness. She is sexually active with no problems. Complains of cough for 3 months. Former smoker. Started on plaquenil for RA in February. Patient complains of discharge but denies odor, burning or itching.  Primary care provider: Garry Heater, MD  Review of Systems  Constitutional:  Positive for fatigue. Negative for activity change, appetite change, chills, diaphoresis, fever and unexpected weight change.  HENT:  Negative for congestion, dental problem, drooling, ear discharge, ear pain, facial swelling, hearing loss, mouth sores, nosebleeds, postnasal drip, rhinorrhea, sinus pressure, sinus pain, sneezing, sore throat, tinnitus, trouble swallowing and voice change.   Eyes:  Positive for redness. Negative for photophobia, pain, discharge, itching and visual disturbance.  Respiratory:  Positive for shortness of breath. Negative for apnea, cough, choking, chest tightness, wheezing and stridor.   Cardiovascular:  Negative for chest pain, palpitations and leg swelling.  Gastrointestinal:  Negative for abdominal distention, abdominal pain, anal bleeding, blood in stool, constipation, diarrhea, nausea, rectal pain and vomiting.  Endocrine: Negative for cold intolerance, heat intolerance, polydipsia, polyphagia and polyuria.  Genitourinary:  Positive for vaginal discharge. Negative for decreased urine  volume, difficulty urinating, dyspareunia, dysuria, enuresis, flank pain, frequency, genital sores, hematuria, menstrual problem, pelvic pain, urgency, vaginal bleeding and vaginal pain.  Musculoskeletal:  Positive for arthralgias. Negative for back pain, gait problem, joint swelling, myalgias, neck pain and neck stiffness.  Skin:  Negative for color change, pallor, rash and wound.  Allergic/Immunologic: Negative for environmental allergies, food allergies and immunocompromised state.  Neurological:  Negative for dizziness, tremors, seizures, syncope, facial asymmetry, speech difficulty, weakness, light-headedness, numbness and headaches.  Hematological:  Negative for adenopathy. Does not bruise/bleed easily.  Psychiatric/Behavioral:  Negative for agitation, behavioral problems, confusion, decreased concentration, dysphoric mood, hallucinations, self-injury, sleep disturbance and suicidal ideas. The patient is not nervous/anxious and is not hyperactive.    Menstrual History  She denies postmenopausal bleeding or cramping. Age at menopause: 77  She denies abnormal paps or pelvic infections. She denies domestic violence or sexual abuse. Health Maintenance  Topic Date Due   Hepatitis C Screening  Never done   COVID-19 Vaccine (4 - Booster for Pfizer series) 10/31/2020   Pneumonia Vaccine 20+ Years old (1 - PCV) Never done   Zoster Vaccines- Shingrix (2 of 2) 06/26/2021   MAMMOGRAM  01/31/2022   INFLUENZA VACCINE  06/12/2022   TETANUS/TDAP  08/29/2025   COLONOSCOPY (Pts 45-79yrs Insurance coverage will need to be confirmed)  08/13/2027   DEXA SCAN  Completed   HPV VACCINES  Aged Out    Past Medical History:  Diagnosis Date   Arthritis    hands, knees   Hypertension    Past Surgical History:  Procedure Laterality Date   CARPAL TUNNEL RELEASE Right 11/07/2018   Procedure: CARPAL TUNNEL RELEASE;  Surgeon: Erin Sons, MD;  Location: [facility];  Service: Orthopedics;   Laterality: Right;   CATARACT EXTRACTION  W/PHACO Right 09/02/2018   Procedure: CATARACT EXTRACTION PHACO AND INTRAOCULAR LENS PLACEMENT (IOC)  RIGHT;  Surgeon: Nevada Crane, MD;  Location: [facility];  Service: Ophthalmology;  Laterality: Right;   CATARACT EXTRACTION W/PHACO Left 08/17/2019   Procedure: CATARACT EXTRACTION PHACO AND INTRAOCULAR LENS PLACEMENT (IOC) LEFT  00:30.8  10.4%  3.30;  Surgeon: Nevada Crane, MD;  Location: [facility];  Service: Ophthalmology;  Laterality: Left;   COLONOSCOPY     FOOT SURGERY Right    Family History  Problem Relation Age of Onset   Polymyositis Mother    CAD Father    Breast cancer Neg Hx    Social History   Socioeconomic History   Marital status: Divorced    Spouse name: Not on file   Number of children: Not on file   Years of education: Not on file   Highest education level: Not on file  Occupational History   Not on file  Tobacco Use   Smoking status: Former    Years: 15.00    Types: Cigarettes    Quit date: 11/2013    Years since quitting: 8.4   Smokeless tobacco: Never   Tobacco comments:    smoked socially  Vaping Use   Vaping Use: Never used  Substance and Sexual Activity   Alcohol use: Not Currently    Alcohol/week: 0.0 standard drinks of alcohol    Comment: Quit 09/14/2018   Drug use: No   Sexual activity: Not Currently    Birth control/protection: Post-menopausal  Other Topics Concern   Not on file  Social History Narrative   Not on file   Social Determinants of Health   Financial Resource Strain: Not on file  Food Insecurity: Not on file  Transportation Needs: Not on file  Physical Activity: Inactive (11/22/2017)   Exercise Vital Sign    Days of Exercise per Week: 0 days    Minutes of Exercise per Session: 0 min  Stress: Stress Concern Present (11/22/2017)   [employer] of Occupational Health - Occupational Stress Questionnaire    Feeling of Stress : Rather much  Social  Connections: Moderately Isolated (11/22/2017)   Social Connection and Isolation Panel [NHANES]    Frequency of Communication with Friends and Family: Once a week    Frequency of Social Gatherings with Friends and Family: More than three times a week    Attends Religious Services: Never    Database administrator or Organizations: No    Attends Banker Meetings: Never    Marital Status: Divorced  Catering manager Violence: Not At Risk (11/22/2017)   Humiliation, Afraid, Rape, and Kick questionnaire    Fear of Current or Ex-Partner: No    Emotionally Abused: No    Physically Abused: No    Sexually Abused: No   Medicine list and allergies reviewed and updated.    Objective:  BP 122/70   Ht  (1.575 m)   Wt 157 lb 12.8 oz (71.6 kg)   BMI 28.86 kg/m      No data to display             No data to display         Physical Exam Vitals and nursing note reviewed. Exam conducted with a chaperone present.  Constitutional:      General: She is not in acute distress.    Appearance: Normal appearance. She is not ill-appearing.  HENT:     Head: Normocephalic and atraumatic.  Mouth/Throat:     Mouth: Mucous membranes are moist.     Pharynx: No oropharyngeal exudate.  Eyes:     Extraocular Movements: Extraocular movements intact.  Neck:     Thyroid: No thyromegaly.  Cardiovascular:     Rate and Rhythm: Normal rate and regular rhythm.     Heart sounds: Normal heart sounds.  Pulmonary:     Effort: Pulmonary effort is normal.     Breath sounds: Normal breath sounds.  Chest:     Chest wall: No mass or tenderness.  Breasts:    Breasts are symmetrical.     Right: Normal. No mass, nipple discharge, skin change or tenderness.     Left: Normal. No mass, nipple discharge, skin change or tenderness.  Abdominal:     General: There is no distension.     Palpations: There is no mass.     Tenderness: There is no abdominal tenderness. There is no guarding or rebound.      Hernia: No hernia is present.  Genitourinary:    Exam position: Lithotomy position.     Labia:        Right: No rash, tenderness or lesion.        Left: No rash, tenderness or lesion.      Urethra: No prolapse or urethral lesion.     Vagina: Normal. No vaginal discharge, tenderness, lesions or prolapsed vaginal walls.     Cervix: No discharge, lesion or cervical bleeding.     Uterus: Normal. Not enlarged, not tender and no uterine prolapse.      Adnexa:        Right: No tenderness or fullness.         Left: No tenderness or fullness.       Rectum: Normal. No tenderness or external hemorrhoid. Normal anal tone.     Comments: External genitalia - atrophic with normal hair distribution, no lesions noted Urethral meatus - no prolapse, no lesions noted  Urethra - no tenderness or scarring noted, no masses noted  Bladder - normal, no masses or tenderness noted Vagina -  atrophic with decreased rugae, no discharge, no lesions noted, pelvic relaxation Cervix - small with no lesions noted, atrophic  Uterus - small, nontender, mobile with adequate pelvic support  Adnexa - no masses palpated, no tenderness Rectal - normal sphincter tone, no hemorrhoids or masses noted Musculoskeletal:        General: Normal range of motion.     Cervical back: Normal range of motion. No tenderness.     Right lower leg: No edema.     Left lower leg: No edema.  Lymphadenopathy:     Head:     Right side of head: Tonsillar adenopathy present.     Left side of head: Tonsillar adenopathy present.     Cervical: No cervical adenopathy.     Upper Body:     Right upper body: No axillary adenopathy.     Left upper body: No axillary adenopathy.     Lower Body: No right inguinal adenopathy. No left inguinal adenopathy.     Comments: Lymph nodes feel slightly enlarged  Skin:    General: Skin is warm and dry.  Neurological:     General: No focal deficit present.     Mental Status: She is alert and oriented to  person, place, and time. Mental status is at baseline.     Coordination: Coordination normal.     Deep Tendon Reflexes: Reflexes normal.  Psychiatric:  Mood and Affect: Mood normal.        Behavior: Behavior normal.        Thought Content: Thought content normal.        Judgment: Judgment normal.    Assessment/Plan:   Encounter for gynecological examination with abnormal finding  Former smoker - Plan: Ambulatory Referral for Lung Cancer Scre  Encounter for screening mammogram for malignant neoplasm of breast - Plan: MM 3D SCREEN BREAST BILATERAL  Osteopenia, unspecified location  New onset cough - Plan: Ambulatory Referral for Lung Cancer Scre  Vaginal discharge - Plan: NuSwab Vaginitis (VG)  Rheumatoid arthritis, involving unspecified site, unspecified whether rheumatoid factor present (HCC)  No pap needed age >65. VG obtained for discharge Breast exam recommended. Mammogram ordered Daily multivitamin recommended. Calcium and Vitamin D recommended. Colonoscopy UTD Bone density recommended in 2-3 yrs as osteopenia  FRAX score is calculated as : 13 % risk of Major osteoporotic fracture /1.9 % risk of hip fracture (not taking into account ankle fracture 6 weeks ago; pt will discuss with endocrinologist Follows with endocrine for RA Wellness labs per PCP  Exercise discussed with patient.  The 10-year ASCVD risk score (Arnett DK, et al., 2019) is: 6.4%   Values used to calculate the score:     Age: 66 years     Sex: Female     Is Non-Hispanic African American: No     Diabetic: No     Tobacco smoker: No     Systolic Blood Pressure: 122 mmHg     Is BP treated: No     HDL Cholesterol: 42 mg/dL     Total Cholesterol: 213 mg/dL The U.S. Preventive Services Task Force recommend yearly lung cancer screening with low-dose CT for people who-- Have a history of heavy smoking, and  Smoke now or have quit within the past 15 years, and  Are between 76 and 84 years old. Heavy  smoking means a cigarette smoking history of 30 pack years or more. A pack year is smoking an average of one pack of cigarettes per day for one year. For example, a person could have a 30 pack-year history by smoking one pack a day for 30 years or two packs a day for 15 years  Return in about 1 year (around 04/27/2023), or if symptoms worsen or fail to improve, for annual/well woman.

## 2022-04-29 LAB — NUSWAB VAGINITIS (VG)
Atopobium vaginae: HIGH Score — AB
Candida albicans, NAA: NEGATIVE
Candida glabrata, NAA: NEGATIVE
Trich vag by NAA: NEGATIVE

## 2022-04-30 ENCOUNTER — Other Ambulatory Visit: Payer: Self-pay

## 2022-04-30 MED ORDER — METRONIDAZOLE 500 MG PO TABS: 500.0000 mg | ORAL_TABLET | Freq: Two times a day (BID) | ORAL | 0 refills | Status: AC

## 2022-04-30 NOTE — Telephone Encounter (Signed)
Pt aware of medication ready for pick up

## 2022-04-30 NOTE — Telephone Encounter (Signed)
Pt calling about labs, do I send in metronidazole 500mg  2 times a day for 7 day?

## 2022-07-19 ENCOUNTER — Telehealth: Payer: Self-pay

## 2022-07-19 NOTE — Telephone Encounter (Signed)
Pt calling; saw Dr. Okey Dupre 6.15/23; ref sent for lung ca screening; ref was to be sent to radiology office across the street from [facility];  Pt hasn't heard from them but about a month ago she did hear from someone who said they were working on her approval to get this scan done.  Has never heard back from them.  Can we tell her who we sent the order to so she can call them?  [phone]

## 2022-08-30 NOTE — Telephone Encounter (Signed)
I contacted patient via phone. Patient has already had mammogram and she saying she is not able to due to Lung cancer screening due to insurance. Patient said she is all good and is thankful for the follow up on her care.

## 2022-08-30 NOTE — Telephone Encounter (Signed)
I contacted Sharyn Lull with [facility] ED of Lung cancer program. No answer. I left message for her to contact me back about patient's referral

## 2022-08-30 NOTE — Telephone Encounter (Signed)
I contact patient via phone. I left message for patient to contacted [facility] at [phone] and let her know we are in the process of ordering Lung cancer screening for patient to call back.

## 2022-08-30 NOTE — Telephone Encounter (Signed)
Pt was referred to [facility] lung cancer screening program. See referral section. Pls f/u with [facility] re: this.

## 2022-11-21 NOTE — Progress Notes (Signed)
LCS Referral received. I have called the patient who states that she is seeking LCS elsewhere closer to home. Referral closed at this time.

## 2023-06-30 NOTE — Progress Notes (Unsigned)
PCP: Garry Heater, MD   No chief complaint on file.   HPI:      Ms. Lauren Bush is a 67 y.o. [id number] whose LMP was No LMP recorded. Patient is postmenopausal., presents today for her Medicare annual examination.  Her menses are absent due to menopause,  She {does:18564} have vasomotor sx.   Sex activity: {sex active: 315163}. She {does:18564} have vaginal dryness.  Last Pap: 04/27/21 Results were: no abnormalities  Hx of STDs: {STD hx:14358}  Last mammogram: 02/01/20 Results were: normal--routine follow-up in 12 months There is no FH of breast cancer. There is no FH of ovarian cancer. The patient {does:18564} do self-breast exams.  Colonoscopy: {hx:15363}  Repeat due after 10*** years.  DEXA: 4/23 normal spine/osteopenia in hip  Tobacco use: {tob:20664} Alcohol use: {Alcohol:11675} No drug use Exercise: {exercise:31265}  She {does:18564} get adequate calcium and Vitamin D in her diet.  Labs with PCP.   There are no problems to display for this patient.   Past Surgical History:  Procedure Laterality Date   CARPAL TUNNEL RELEASE Right 11/07/2018   Procedure: CARPAL TUNNEL RELEASE;  Surgeon: Erin Sons, MD;  Location: [facility];  Service: Orthopedics;  Laterality: Right;   CATARACT EXTRACTION W/PHACO Right 09/02/2018   Procedure: CATARACT EXTRACTION PHACO AND INTRAOCULAR LENS PLACEMENT (IOC)  RIGHT;  Surgeon: Nevada Crane, MD;  Location: [facility];  Service: Ophthalmology;  Laterality: Right;   CATARACT EXTRACTION W/PHACO Left 08/17/2019   Procedure: CATARACT EXTRACTION PHACO AND INTRAOCULAR LENS PLACEMENT (IOC) LEFT  00:30.8  10.4%  3.30;  Surgeon: Nevada Crane, MD;  Location: [facility];  Service: Ophthalmology;  Laterality: Left;   COLONOSCOPY     FOOT SURGERY Right     Family History  Problem Relation Age of Onset   Polymyositis Mother    CAD Father    Breast cancer Neg Hx     Social History    Socioeconomic History   Marital status: Divorced    Spouse name: Not on file   Number of children: Not on file   Years of education: Not on file   Highest education level: Not on file  Occupational History   Not on file  Tobacco Use   Smoking status: Former    Current packs/day: 0.00    Types: Cigarettes    Start date: 11/1998    Quit date: 11/2013    Years since quitting: 9.6   Smokeless tobacco: Never   Tobacco comments:    smoked socially  Vaping Use   Vaping status: Never Used  Substance and Sexual Activity   Alcohol use: Not Currently    Alcohol/week: 0.0 standard drinks of alcohol    Comment: Quit 09/14/2018   Drug use: No   Sexual activity: Not Currently    Birth control/protection: Post-menopausal  Other Topics Concern   Not on file  Social History Narrative   Not on file   Social Determinants of Health   Financial Resource Strain: Not on file  Food Insecurity: No Food Insecurity (10/24/2022)   Received from [facility], [facility]   Hunger Vital Sign    Worried About Running Out of Food in the Last Year: Never true    Ran Out of Food in the Last Year: Never true  Transportation Needs: No Transportation Needs (10/24/2022)   Received from [facility], [facility]   [facility] - Transportation    In the  past 12 months, has lack of transportation kept you from medical appointments or from getting medications?: No    Lack of Transportation (Non-Medical): No  Physical Activity: Inactive (11/22/2017)   Exercise Vital Sign    Days of Exercise per Week: 0 days    Minutes of Exercise per Session: 0 min  Stress: Stress Concern Present (11/22/2017)   [employer] of Occupational Health - Occupational Stress Questionnaire    Feeling of Stress : Rather much  Social Connections: Moderately Isolated (11/22/2017)   Social Connection and Isolation Panel [NHANES]    Frequency of Communication with  Friends and Family: Once a week    Frequency of Social Gatherings with Friends and Family: More than three times a week    Attends Religious Services: Never    Database administrator or Organizations: No    Attends Banker Meetings: Never    Marital Status: Divorced  Catering manager Violence: Not At Risk (11/22/2017)   Humiliation, Afraid, Rape, and Kick questionnaire    Fear of Current or Ex-Partner: No    Emotionally Abused: No    Physically Abused: No    Sexually Abused: No     Current Outpatient Medications:    Biotin 1 MG CAPS, , Disp: , Rfl:    Calcium Carbonate-Vit D-Min (CALCIUM 1200 PO), Take by mouth., Disp: , Rfl:    Flaxseed, Linseed, (FLAX SEEDS PO), Take by mouth., Disp: , Rfl:    hydroxychloroquine (PLAQUENIL) 200 MG tablet, hydroxychloroquine 200 mg tablet   200 mg twice a day by oral route., Disp: , Rfl:    Magnesium Oxide -Mg Supplement 250 MG TABS, Take by mouth., Disp: , Rfl:    Multiple Vitamin (MULTI-VITAMIN DAILY PO), Take by mouth., Disp: , Rfl:    Multiple Vitamins-Minerals (OCUVITE PO), Take by mouth., Disp: , Rfl:    SUPER COLLAGEN PLUS VITAMIN C 1000-10 MG TABS, , Disp: , Rfl:    Turmeric (QC TUMERIC COMPLEX PO), Take by mouth., Disp: , Rfl:      ROS:  Review of Systems BREAST: No symptoms    Objective: There were no vitals taken for this visit.   OBGyn Exam  Results: No results found for this or any previous visit (from the past 24 hour(s)).  Assessment/Plan:  No diagnosis found.   No orders of the defined types were placed in this encounter.           GYN counsel {counseling: 16159}    F/U  No follow-ups on file.  Julie-Anne Torain B. Rachele Lamaster, PA-C 06/30/2023 3:58 PM

## 2023-07-01 ENCOUNTER — Ambulatory Visit (INDEPENDENT_AMBULATORY_CARE_PROVIDER_SITE_OTHER): Payer: Medicare HMO | Admitting: Obstetrics and Gynecology

## 2023-07-01 ENCOUNTER — Encounter: Payer: Self-pay | Admitting: Obstetrics and Gynecology

## 2023-07-01 VITALS — BP 123/86 | HR 86 | Resp 16 | Ht 62.0 in | Wt 152.9 lb

## 2023-07-01 DIAGNOSIS — Z01419 Encounter for gynecological examination (general) (routine) without abnormal findings: Secondary | ICD-10-CM

## 2023-07-01 DIAGNOSIS — Z1211 Encounter for screening for malignant neoplasm of colon: Secondary | ICD-10-CM

## 2023-07-01 DIAGNOSIS — Z1231 Encounter for screening mammogram for malignant neoplasm of breast: Secondary | ICD-10-CM

## 2023-07-01 NOTE — Patient Instructions (Signed)
I value your feedback and you entrusting us with your care. If you get a [facility] patient survey, I would appreciate you taking the time to let us know about your experience today. Thank you! ? ? ?

## 2023-08-05 LAB — HM MAMMOGRAPHY

## 2023-08-06 ENCOUNTER — Encounter: Payer: Self-pay | Admitting: Obstetrics and Gynecology

## 2024-08-13 ENCOUNTER — Encounter: Payer: Self-pay | Admitting: Obstetrics and Gynecology

## 2024-08-13 ENCOUNTER — Ambulatory Visit (INDEPENDENT_AMBULATORY_CARE_PROVIDER_SITE_OTHER): Admitting: Obstetrics and Gynecology

## 2024-08-13 ENCOUNTER — Other Ambulatory Visit (HOSPITAL_COMMUNITY)
Admission: RE | Admit: 2024-08-13 | Discharge: 2024-08-13 | Disposition: A | Discharge status: Home / Self Care | Source: Ambulatory Visit | Attending: Obstetrics and Gynecology | Admitting: Obstetrics and Gynecology

## 2024-08-13 VITALS — BP 122/71 | HR 73 | Ht 62.0 in | Wt 142.0 lb

## 2024-08-13 DIAGNOSIS — Z01419 Encounter for gynecological examination (general) (routine) without abnormal findings: Secondary | ICD-10-CM

## 2024-08-13 DIAGNOSIS — N644 Mastodynia: Secondary | ICD-10-CM | POA: Diagnosis not present

## 2024-08-13 DIAGNOSIS — Z01411 Encounter for gynecological examination (general) (routine) with abnormal findings: Secondary | ICD-10-CM | POA: Diagnosis not present

## 2024-08-13 DIAGNOSIS — Z124 Encounter for screening for malignant neoplasm of cervix: Secondary | ICD-10-CM | POA: Insufficient documentation

## 2024-08-13 DIAGNOSIS — Z1231 Encounter for screening mammogram for malignant neoplasm of breast: Secondary | ICD-10-CM

## 2024-08-13 NOTE — Patient Instructions (Signed)
 I value your feedback and you entrusting [place] with your care. If you get a [facility] [place] [facility] patient survey, I would appreciate you taking the time to let us know about your experience today. Thank you! ? ? ?

## 2024-08-13 NOTE — Progress Notes (Signed)
 PCP: Myra Darina Standing, MD   Chief Complaint  Patient presents with   Gynecologic Exam    Tender above LB, chest area x 1 week.     HPI:      Ms. Lauren Bush is a 68 y.o. [id number] whose LMP was No LMP recorded. Patient is postmenopausal., presents today for her annual examination.  Her menses are absent due to menopause, no PMB/pelvic pain. Has occas VS sx.   Sex activity: not sexually active. She does not have vaginal dryness/sx.  Last Pap: 04/27/21 Results were: no abnormalities ; no hx of abn paps. Pt on plaquenil/immunomodulator.   Last mammogram: 08/05/23 at [facility]: Results were: normal--routine follow-up in 12 months There is no FH of breast cancer. There is no FH of ovarian cancer. The patient does not do self-breast exams. Has noticed tenderness to palpation on chest wall/upper LT breast for the past wk. No known trauma, no erythema/mass/nipple d/c. Area feel swollen. Drinks 2 cups coffee daily.   Colonoscopy: 2017 at [facility]; Repeat due after 10 years.  DEXA: 4/23 normal spine/osteopenia in hip; has orders with PCP  Tobacco use: The patient denies current or previous tobacco use. Alcohol use: few drinks nightly No drug use Exercise: moderately active  She does get adequate calcium and Vitamin D in her diet.  Labs with PCP. Diagnosed with Sjogrens as well as RA.    Past Medical History:  Diagnosis Date   Arthritis    hands, knees   Hypertension    Rheumatoid arthritis (HCC)    Sjogren syndrome     Past Surgical History:  Procedure Laterality Date   CARPAL TUNNEL RELEASE Right 11/07/2018   Procedure: CARPAL TUNNEL RELEASE;  Surgeon: Maryl Barters, MD;  Location: [facility];  Service: Orthopedics;  Laterality: Right;   CATARACT EXTRACTION W/PHACO Right 09/02/2018   Procedure: CATARACT EXTRACTION PHACO AND INTRAOCULAR LENS PLACEMENT (IOC)  RIGHT;  Surgeon: Myrna Adine Anes, MD;  Location: [facility];  Service: Ophthalmology;  Laterality:  Right;   CATARACT EXTRACTION W/PHACO Left 08/17/2019   Procedure: CATARACT EXTRACTION PHACO AND INTRAOCULAR LENS PLACEMENT (IOC) LEFT  00:30.8  10.4%  3.30;  Surgeon: Myrna Adine Anes, MD;  Location: [facility];  Service: Ophthalmology;  Laterality: Left;   COLONOSCOPY  2017   at [facility]   FOOT SURGERY Right     Family History  Problem Relation Age of Onset   Polymyositis Mother    CAD Father    Breast cancer Neg Hx     Social History   Socioeconomic History   Marital status: Divorced    Spouse name: Not on file   Number of children: Not on file   Years of education: Not on file   Highest education level: Not on file  Occupational History   Not on file  Tobacco Use   Smoking status: Former    Current packs/day: 0.00    Types: Cigarettes    Start date: 11/1998    Quit date: 11/2013    Years since quitting: 10.7   Smokeless tobacco: Never   Tobacco comments:    smoked socially  Vaping Use   Vaping status: Never Used  Substance and Sexual Activity   Alcohol use: Not Currently    Alcohol/week: 0.0 standard drinks of alcohol    Comment: Quit 09/14/2018   Drug use: No   Sexual activity: Not Currently    Birth control/protection: Post-menopausal  Other Topics Concern   Not on file  Social History Narrative   Not on file   Social Drivers of Health   Financial Resource Strain: Low Risk  (10/28/2023)   Received from [facility]   Overall Financial Resource Strain (CARDIA)    Difficulty of Paying Living Expenses: Not very hard  Food Insecurity: No Food Insecurity (10/28/2023)   Received from [facility]   Hunger Vital Sign    Within the past 12 months, you worried that your food would run out before you got the money to buy more.: Never true    Within the past 12 months, the food you bought just didn't last and you didn't have money to get more.: Never true  Transportation Needs: No Transportation Needs (10/28/2023)    Received from [facility] - Transportation    In the past 12 months, has lack of transportation kept you from medical appointments or from getting medications?: No    Lack of Transportation (Non-Medical): No  Physical Activity: Inactive (11/22/2017)   Exercise Vital Sign    Days of Exercise per Week: 0 days    Minutes of Exercise per Session: 0 min  Stress: Stress Concern Present (11/22/2017)   [employer] of Occupational Health - Occupational Stress Questionnaire    Feeling of Stress : Rather much  Social Connections: Moderately Isolated (11/22/2017)   Social Connection and Isolation Panel    Frequency of Communication with Friends and Family: Once a week    Frequency of Social Gatherings with Friends and Family: More than three times a week    Attends Religious Services: Never    Database administrator or Organizations: No    Attends Banker Meetings: Never    Marital Status: Divorced  Catering manager Violence: Not At Risk (11/22/2017)   Humiliation, Afraid, Rape, and Kick questionnaire    Fear of Current or Ex-Partner: No    Emotionally Abused: No    Physically Abused: No    Sexually Abused: No     Current Outpatient Medications:    Calcium Carbonate-Vit D-Min (CALCIUM 1200 PO), Take by mouth., Disp: , Rfl:    Flaxseed, Linseed, (FLAX SEEDS PO), Take by mouth., Disp: , Rfl:    hydroxychloroquine (PLAQUENIL) 200 MG tablet, hydroxychloroquine 200 mg tablet   200 mg twice a day by oral route., Disp: , Rfl:    Magnesium Oxide -Mg Supplement 250 MG TABS, Take by mouth., Disp: , Rfl:    Multiple Vitamin (MULTI-VITAMIN DAILY PO), Take by mouth., Disp: , Rfl:    Multiple Vitamins-Minerals (OCUVITE PO), Take by mouth., Disp: , Rfl:    SODIUM FLUORIDE 5000 PPM 1.1 % GEL dental gel, 3 (three) times daily., Disp: , Rfl:    Turmeric (QC TUMERIC COMPLEX PO), Take by mouth., Disp: , Rfl:      ROS:  Review of Systems  Constitutional:   Positive for fatigue. Negative for fever and unexpected weight change.  Respiratory:  Negative for cough, shortness of breath and wheezing.   Cardiovascular:  Negative for chest pain, palpitations and leg swelling.  Gastrointestinal:  Negative for blood in stool, constipation, diarrhea, nausea and vomiting.  Endocrine: Negative for cold intolerance, heat intolerance and polyuria.  Genitourinary:  Negative for dyspareunia, dysuria, flank pain, frequency, genital sores, hematuria, menstrual problem, pelvic pain, urgency, vaginal bleeding, vaginal discharge and vaginal pain.  Musculoskeletal:  Negative for back pain, joint swelling and myalgias.  Skin:  Negative for rash.  Neurological:  Negative for dizziness,  syncope, light-headedness, numbness and headaches.  Hematological:  Negative for adenopathy.  Psychiatric/Behavioral:  Positive for agitation. Negative for confusion, sleep disturbance and suicidal ideas. The patient is not nervous/anxious.    BREAST: tenderness    Objective: BP 122/71   Pulse 73   Ht 5' 2 (1.575 m)   Wt 142 lb (64.4 kg)   BMI 25.97 kg/m    Physical Exam Constitutional:      Appearance: She is well-developed.  Genitourinary:     Vulva normal.     Right Labia: No rash, tenderness or lesions.    Left Labia: No tenderness, lesions or rash.    No vaginal discharge, erythema or tenderness.     Mild vaginal atrophy present.     Right Adnexa: not tender and no mass present.    Left Adnexa: not tender and no mass present.    No cervical friability or polyp.     Uterus is not enlarged or tender.  Breasts:    Right: No mass, nipple discharge, skin change or tenderness.     Left: Tenderness present. No mass, nipple discharge or skin change.  Neck:     Thyroid: No thyromegaly.  Cardiovascular:     Rate and Rhythm: Normal rate and regular rhythm.     Heart sounds: Normal heart sounds. No murmur heard. Pulmonary:     Effort: Pulmonary effort is normal.      Breath sounds: Normal breath sounds.  Chest:    Abdominal:     Palpations: Abdomen is soft.     Tenderness: There is no abdominal tenderness. There is no guarding or rebound.  Musculoskeletal:        General: Normal range of motion.     Cervical back: Normal range of motion.  Lymphadenopathy:     Cervical: No cervical adenopathy.  Neurological:     General: No focal deficit present.     Mental Status: She is alert and oriented to person, place, and time.     Cranial Nerves: No cranial nerve deficit.  Skin:    General: Skin is warm and dry.  Psychiatric:        Mood and Affect: Mood normal.        Behavior: Behavior normal.        Thought Content: Thought content normal.        Judgment: Judgment normal.  Vitals reviewed.     Assessment/Plan: Encounter for annual routine gynecological examination  Cervical cancer screening - Plan: Cytology - PAP; pt on immunomodulator  Encounter for screening mammogram for malignant neoplasm of breast - Plan: MM 3D SCREENING MAMMOGRAM BILATERAL BREAST; pt to schedule mammo  Breast pain--left chest wall, tender to palpate, no masses. Question MSK. Can do screening mammo for now; d/c caffeine. F/u if sx persist.          GYN counsel breast self exam, mammography screening, menopause, adequate intake of calcium and vitamin D, diet and exercise    F/U  Return in about 1 year (around 08/13/2025).  Karman Biswell B. Cleotis Sparr, PA-C 08/13/2024 4:45 PM

## 2024-08-17 LAB — CYTOLOGY - PAP: Diagnosis: NEGATIVE

## 2024-09-03 ENCOUNTER — Other Ambulatory Visit: Payer: Self-pay | Admitting: Family Medicine

## 2024-09-03 DIAGNOSIS — Z780 Asymptomatic menopausal state: Secondary | ICD-10-CM

## 2024-09-03 DIAGNOSIS — M438X9 Other specified deforming dorsopathies, site unspecified: Secondary | ICD-10-CM

## 2024-10-07 ENCOUNTER — Ambulatory Visit
Admission: RE | Admit: 2024-10-07 | Discharge: 2024-10-07 | Disposition: A | Discharge status: Home / Self Care | Source: Ambulatory Visit | Attending: Family Medicine | Admitting: Family Medicine

## 2024-10-07 DIAGNOSIS — M438X9 Other specified deforming dorsopathies, site unspecified: Secondary | ICD-10-CM | POA: Insufficient documentation

## 2024-10-07 DIAGNOSIS — Z780 Asymptomatic menopausal state: Secondary | ICD-10-CM | POA: Insufficient documentation
